# Patient Record
Sex: Female | Born: 1940
Health system: Southern US, Community
[De-identification: ages and names within clinical notes are randomized; demographics above are authoritative.]

## PROBLEM LIST (undated history)

## (undated) DIAGNOSIS — K219 Gastro-esophageal reflux disease without esophagitis: Secondary | ICD-10-CM

## (undated) DIAGNOSIS — IMO0001 Reserved for inherently not codable concepts without codable children: Secondary | ICD-10-CM

## (undated) DIAGNOSIS — I10 Essential (primary) hypertension: Secondary | ICD-10-CM

## (undated) DIAGNOSIS — K589 Irritable bowel syndrome without diarrhea: Secondary | ICD-10-CM

## (undated) DIAGNOSIS — B029 Zoster without complications: Secondary | ICD-10-CM

## (undated) DIAGNOSIS — E785 Hyperlipidemia, unspecified: Secondary | ICD-10-CM

## (undated) HISTORY — DX: Essential (primary) hypertension: I10

## (undated) HISTORY — DX: Gastro-esophageal reflux disease without esophagitis: K21.9

## (undated) HISTORY — DX: Reserved for inherently not codable concepts without codable children: IMO0001

## (undated) HISTORY — DX: Irritable bowel syndrome, unspecified: K58.9

## (undated) HISTORY — DX: Zoster without complications: B02.9

## (undated) HISTORY — DX: Hyperlipidemia, unspecified: E78.5

---

## 1975-05-28 HISTORY — PX: TOTAL ABDOMINAL HYSTERECTOMY W/ BILATERAL SALPINGOOPHORECTOMY: SHX83

## 1979-05-28 HISTORY — PX: PLACEMENT OF BREAST IMPLANTS: SHX6334

## 1999-07-06 ENCOUNTER — Other Ambulatory Visit: Admission: RE | Admit: 1999-07-06 | Discharge: 1999-07-06 | Payer: Self-pay | Admitting: Internal Medicine

## 2000-07-08 ENCOUNTER — Other Ambulatory Visit: Admission: RE | Admit: 2000-07-08 | Discharge: 2000-07-08 | Payer: Self-pay | Admitting: Internal Medicine

## 2000-09-15 ENCOUNTER — Encounter: Admission: RE | Admit: 2000-09-15 | Discharge: 2000-09-15 | Payer: Self-pay | Admitting: Internal Medicine

## 2000-09-15 ENCOUNTER — Encounter: Payer: Self-pay | Admitting: Internal Medicine

## 2001-07-17 ENCOUNTER — Other Ambulatory Visit: Admission: RE | Admit: 2001-07-17 | Discharge: 2001-07-17 | Payer: Self-pay | Admitting: Internal Medicine

## 2002-07-08 ENCOUNTER — Other Ambulatory Visit: Admission: RE | Admit: 2002-07-08 | Discharge: 2002-07-08 | Payer: Self-pay | Admitting: Internal Medicine

## 2002-10-19 ENCOUNTER — Ambulatory Visit (HOSPITAL_BASED_OUTPATIENT_CLINIC_OR_DEPARTMENT_OTHER): Admission: RE | Admit: 2002-10-19 | Discharge: 2002-10-19 | Payer: Self-pay | Admitting: Plastic Surgery

## 2002-10-19 ENCOUNTER — Encounter (INDEPENDENT_AMBULATORY_CARE_PROVIDER_SITE_OTHER): Payer: Self-pay | Admitting: Specialist

## 2005-07-16 ENCOUNTER — Other Ambulatory Visit: Admission: RE | Admit: 2005-07-16 | Discharge: 2005-07-16 | Payer: Self-pay | Admitting: Internal Medicine

## 2005-08-21 ENCOUNTER — Ambulatory Visit: Payer: Self-pay

## 2007-07-24 ENCOUNTER — Encounter: Admission: RE | Admit: 2007-07-24 | Discharge: 2007-07-24 | Payer: Self-pay | Admitting: Internal Medicine

## 2008-08-09 ENCOUNTER — Ambulatory Visit: Payer: Self-pay | Admitting: Internal Medicine

## 2008-08-09 ENCOUNTER — Other Ambulatory Visit: Admission: RE | Admit: 2008-08-09 | Discharge: 2008-08-09 | Payer: Self-pay | Admitting: Internal Medicine

## 2008-08-18 ENCOUNTER — Encounter: Admission: RE | Admit: 2008-08-18 | Discharge: 2008-08-18 | Payer: Self-pay | Admitting: Internal Medicine

## 2009-02-06 ENCOUNTER — Ambulatory Visit: Payer: Self-pay | Admitting: Internal Medicine

## 2009-08-07 ENCOUNTER — Ambulatory Visit: Payer: Self-pay | Admitting: Internal Medicine

## 2009-12-28 ENCOUNTER — Ambulatory Visit: Payer: Self-pay | Admitting: Internal Medicine

## 2010-03-13 ENCOUNTER — Ambulatory Visit: Payer: Self-pay | Admitting: Internal Medicine

## 2010-04-13 ENCOUNTER — Ambulatory Visit: Payer: Self-pay | Admitting: Internal Medicine

## 2010-04-19 IMAGING — CT CT HEAD W/O CM
2 series · 16 of 30 positions shown, 20 images · non-contrast
Comparison: None.

CLINICAL DATA: Visual disturbance.

CT HEAD WITHOUT CONTRAST
TECHNIQUE: Contiguous axial images were obtained from the base of
the skull through the vertex without contrast.

[Series 3: bone windows · axial · 0.43mm/px · z∈[+19,+60]mm · 3 of 28 slices shown]
[im 2/28  bone]
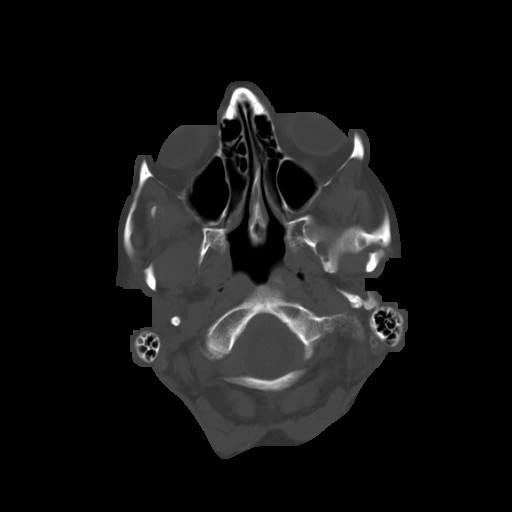
[im 6/28  bone]
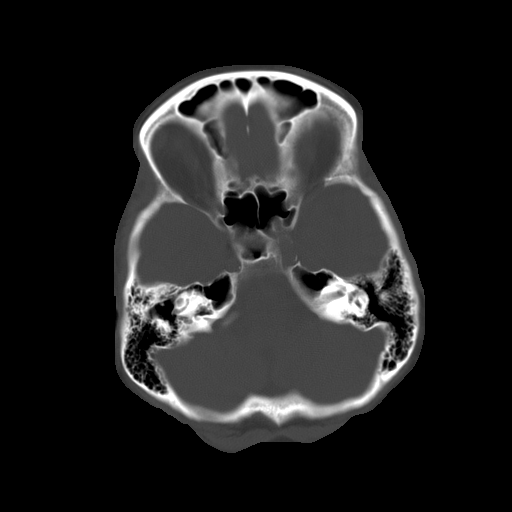
[im 10/28  bone]
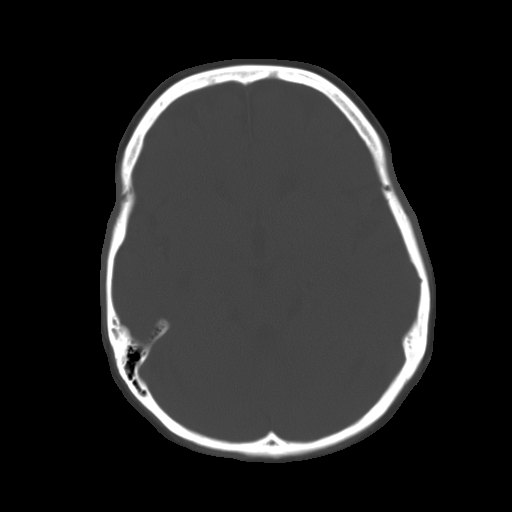

[Series 32: 3d filtered head w/o · axial · non-contrast · 0.43mm/px · z∈[+19,+140]mm · 13 of 28 slices shown, 17 images]
[im 2/28  brain]
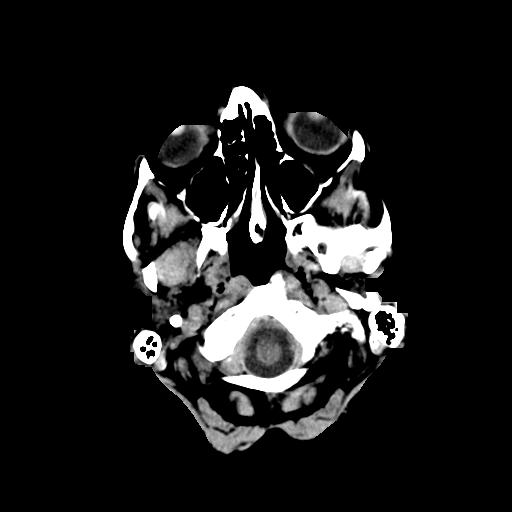
[im 2/28  bone]
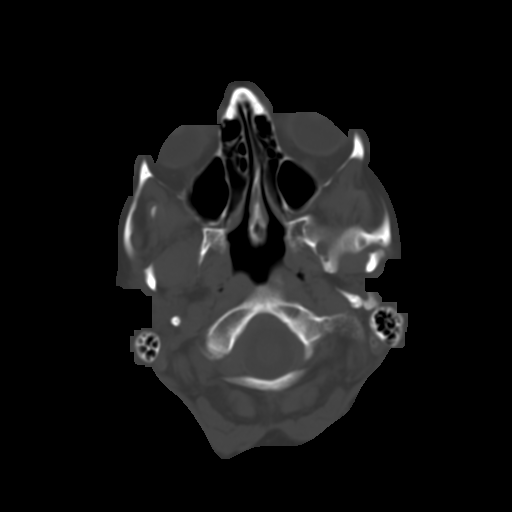
[im 4/28  brain]
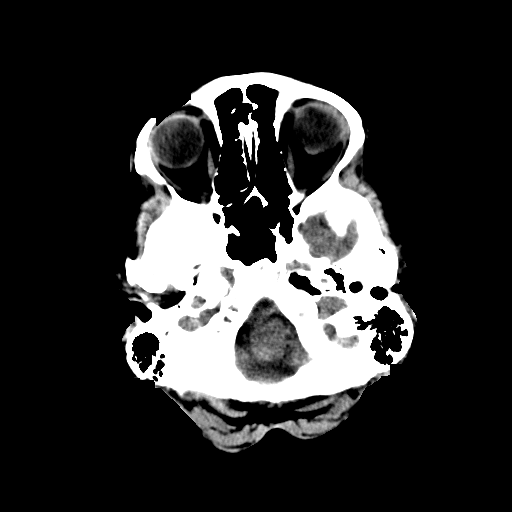
[im 6/28  brain]
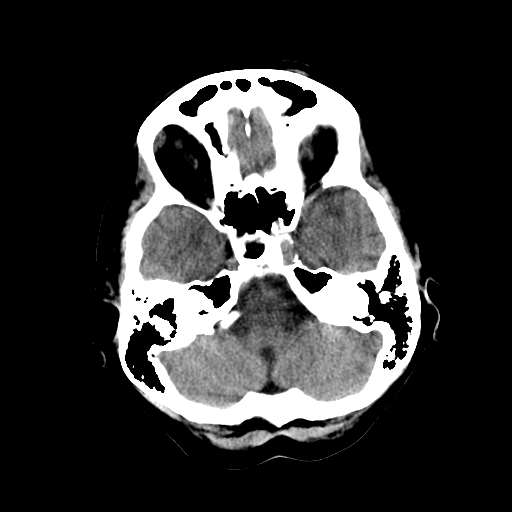
[im 8/28  brain]
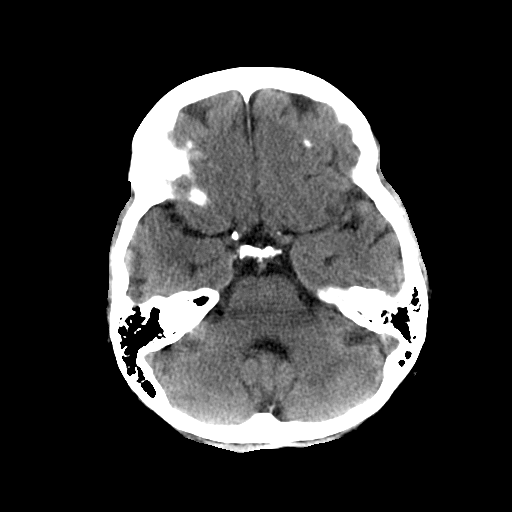
[im 10/28  brain]
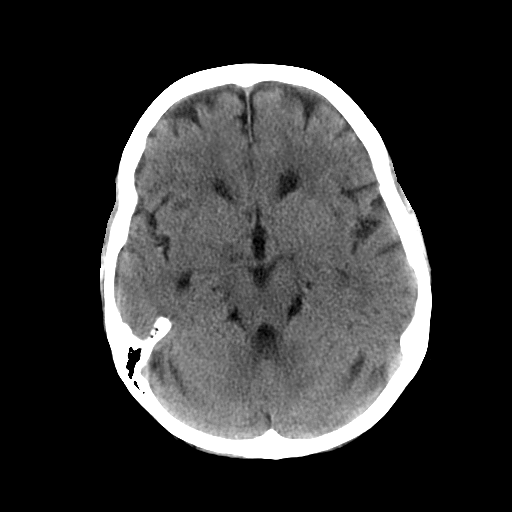
[im 10/28  bone]
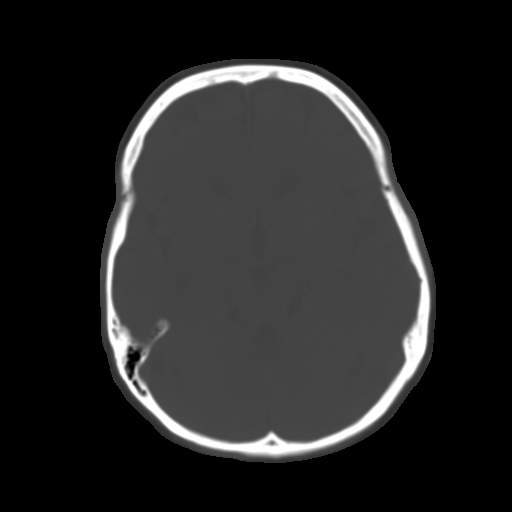
[im 12/28  brain]
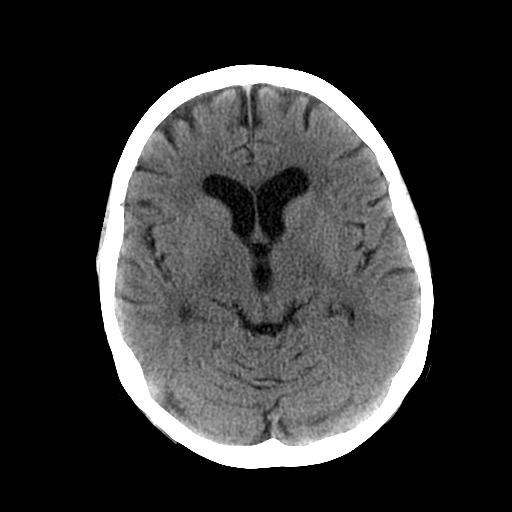
[im 14/28  brain]
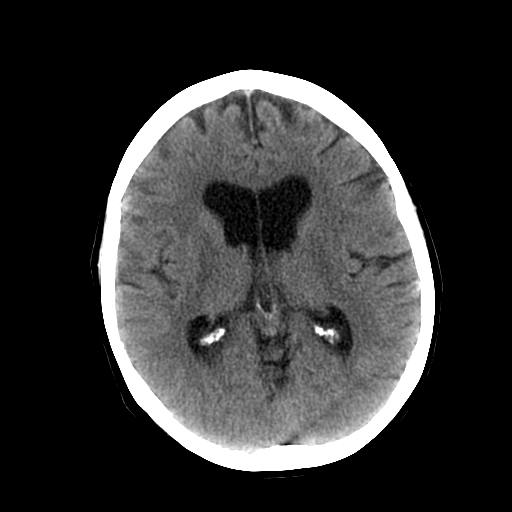
[im 16/28  brain]
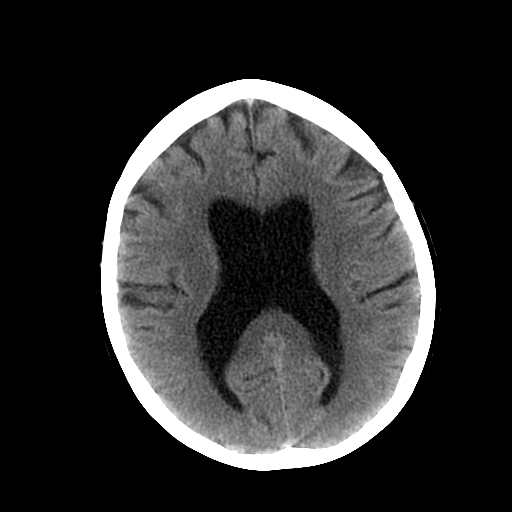
[im 18/28  brain]
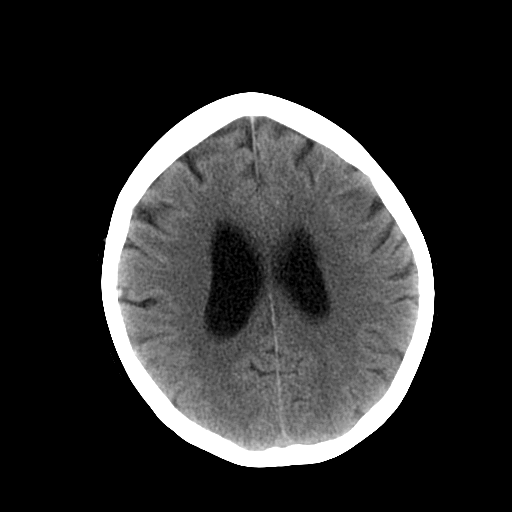
[im 18/28  bone]
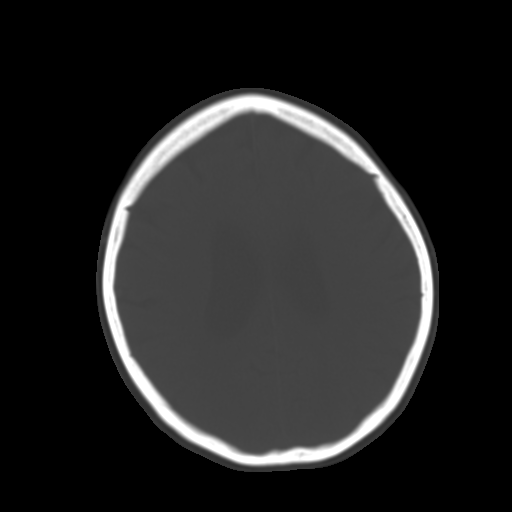
[im 20/28  brain]
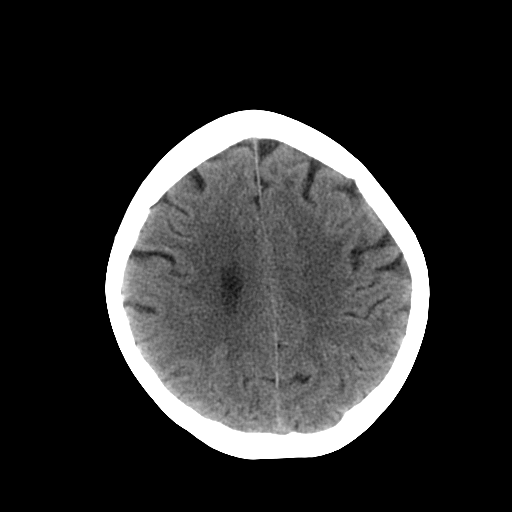
[im 22/28  brain]
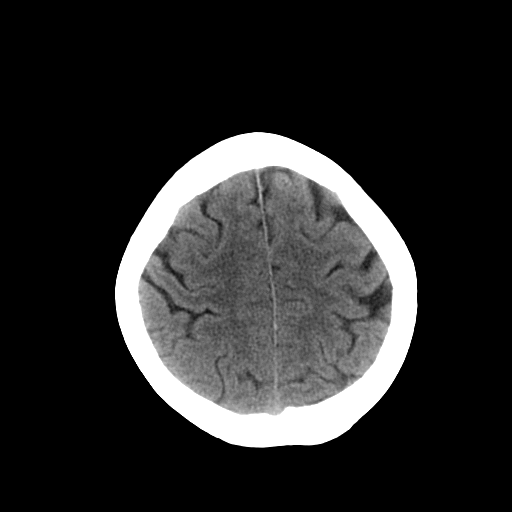
[im 24/28  brain]
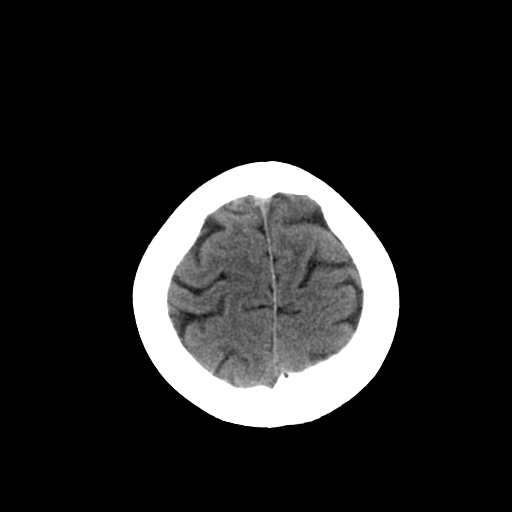
[im 26/28  brain]
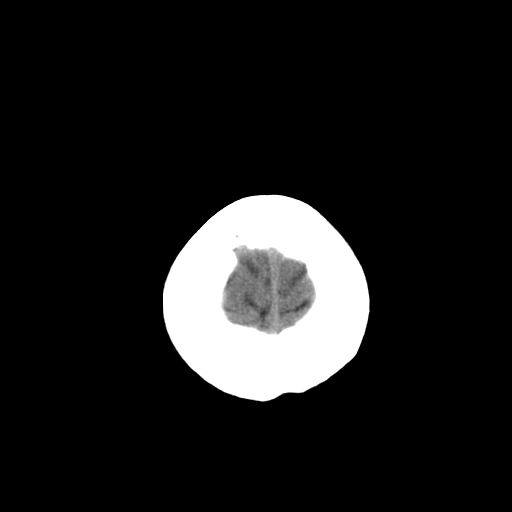
[im 26/28  bone]
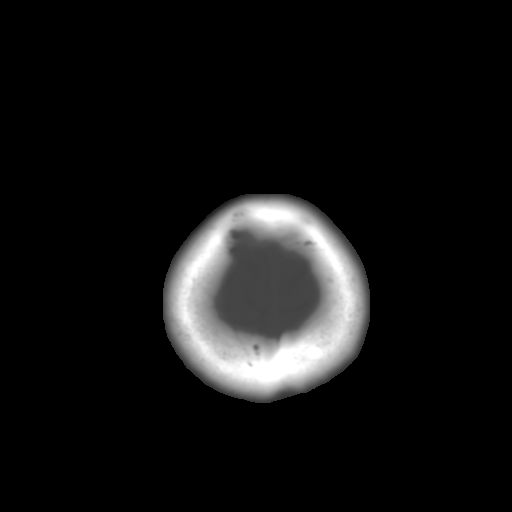

[16 of 30 positions shown; findings below may reference images not displayed]

FINDINGS: There is no evidence for acute hemorrhage, hydrocephalus,
mass lesion, or abnormal extra-axial fluid collection.  No definite
CT evidence for acute infarction.

The visualized paranasal sinuses and mastoid air cells are clear.
IMPRESSION: No acute intracranial abnormality.

Areas of acute ischemia may be inapparent on CT examination of the
brain for up to 24-48 hours.

## 2010-05-31 ENCOUNTER — Ambulatory Visit
Admission: RE | Admit: 2010-05-31 | Discharge: 2010-05-31 | Payer: Self-pay | Source: Home / Self Care | Attending: Internal Medicine | Admitting: Internal Medicine

## 2010-10-08 ENCOUNTER — Other Ambulatory Visit: Payer: Self-pay | Admitting: Internal Medicine

## 2010-10-12 NOTE — Op Note (Signed)
NAME:  Kristen Hopkins, Kristen Hopkins                          ACCOUNT NO.:  [id number]   MEDICAL RECORD NO.:  [id number]                   PATIENT TYPE:  AMB   LOCATION:  [facility]                                  FACILITY:  [facility]   PHYSICIAN:  Renee Pain, M.D.               DATE OF BIRTH:  12-16-1940   DATE OF PROCEDURE:  10/19/2002  DATE OF DISCHARGE:                                 OPERATIVE REPORT   PREOPERATIVE DIAGNOSIS:  A 1.5 cm lipoma, left forehead, just above the  medial eyebrow.   POSTOPERATIVE DIAGNOSIS:  A 1.5 cm lipoma, left forehead, just above the  medial eyebrow.   OPERATION:  Excision of submuscular (frontalis), lipoma of left forehead.   SURGEON:  Renee Pain, M.D.   ANESTHESIA:  MAC, supplemented with 1% Xylocaine and 1:100,000 epinephrine.   INDICATIONS FOR PROCEDURE:  This is a 70 year old woman with a steadily  enlarging left forehead lump mass.  She wishes to have this removed.  Clinically it appears to be a lipoma.  In light of the fact that it has been  growing and becoming more noticeable, she wishes to have it removed.  She  understands that she will be trading what she has for a permanent and  potentially unsightly scar.  In addition, because of its location near the  supraorbital vessels and nerves, she may end up with permanent numbness of  the forehead or scalp.  In spite of that potential risk, she wishes to  proceed with the surgery.   DESCRIPTION OF PROCEDURE:  After adequate intravenous analgesia had been  given, local anesthesia was then infiltrated just over the lipoma.  After  waiting for approximately seven minutes, the forehead was prepped with  Betadine and draped with sterile drapes.  An approximately 2.0 cm incision  was made directly over the lipoma and deepened until reaching the  subcutaneous tissue in the frontalis muscle.  The frontalis muscle was  opened and the lipoma was visualized.  There were small branches of the  supraorbital  nerve which were growing over the lipoma.  These were retracted  out of the way.  The lipoma was dissected off of the periosteum of the  frontal bone.  The lipoma was removed in its entirety.  It was passed off  for pathology.  Hemostasis was maintained throughout using electrocautery.  The wound was irrigated with saline irrigation.  The wound was closed by  approximating the subcutaneous tissue and dermis with interrupted #5-0  Vicryl suture.  Steri-Strips were used to unite the skin edges.  The patient tolerated the procedure well, with minimal blood loss.  She was  transported to the recovery room in satisfactory condition.  Renee Pain, M.D.    WBB/MEDQ  D:  10/19/2002  T:  10/19/2002  Job:  [id number]

## 2010-10-14 ENCOUNTER — Other Ambulatory Visit: Payer: Self-pay | Admitting: Internal Medicine

## 2011-01-17 ENCOUNTER — Other Ambulatory Visit: Payer: Self-pay | Admitting: Internal Medicine

## 2011-01-18 ENCOUNTER — Other Ambulatory Visit: Payer: Self-pay

## 2011-01-18 MED ORDER — INDAPAMIDE 1.25 MG PO TABS: 1.2500 mg | ORAL_TABLET | Freq: Every day | ORAL | Status: DC

## 2011-01-18 MED ORDER — ATORVASTATIN CALCIUM 10 MG PO TABS: 10.0000 mg | ORAL_TABLET | Freq: Every day | ORAL | Status: DC

## 2011-04-14 ENCOUNTER — Other Ambulatory Visit: Payer: Self-pay | Admitting: Internal Medicine

## 2011-04-16 ENCOUNTER — Other Ambulatory Visit: Payer: Self-pay

## 2011-06-07 ENCOUNTER — Other Ambulatory Visit: Payer: BC Managed Care – PPO | Admitting: Internal Medicine

## 2011-06-07 DIAGNOSIS — Z79899 Other long term (current) drug therapy: Secondary | ICD-10-CM

## 2011-06-07 DIAGNOSIS — E785 Hyperlipidemia, unspecified: Secondary | ICD-10-CM

## 2011-06-07 DIAGNOSIS — I10 Essential (primary) hypertension: Secondary | ICD-10-CM

## 2011-06-07 LAB — CBC WITH DIFFERENTIAL/PLATELET
Basophils Absolute: 0.1 10*3/uL (ref 0.0–0.1)
Eosinophils Relative: 2 % (ref 0–5)
HCT: 38.2 % (ref 36.0–46.0)
Lymphocytes Relative: 33 % (ref 12–46)
Lymphs Abs: 3.5 10*3/uL (ref 0.7–4.0)
MCV: 97.9 fL (ref 78.0–100.0)
Monocytes Absolute: 0.6 10*3/uL (ref 0.1–1.0)
Monocytes Relative: 6 % (ref 3–12)
RDW: 13.5 % (ref 11.5–15.5)
WBC: 10.6 10*3/uL — ABNORMAL HIGH (ref 4.0–10.5)

## 2011-06-07 LAB — LIPID PANEL
Cholesterol: 147 mg/dL (ref 0–200)
HDL: 72 mg/dL (ref >39)
Triglycerides: 99 mg/dL (ref <150)

## 2011-06-07 LAB — COMPREHENSIVE METABOLIC PANEL
BUN: 15 mg/dL (ref 6–23)
CO2: 26 mEq/L (ref 19–32)
Calcium: 9.8 mg/dL (ref 8.4–10.5)
Chloride: 98 mEq/L (ref 96–112)
Creat: 0.61 mg/dL (ref 0.50–1.10)
Glucose, Bld: 96 mg/dL (ref 70–99)

## 2011-06-08 LAB — VITAMIN D 25 HYDROXY (VIT D DEFICIENCY, FRACTURES): Vit D, 25-Hydroxy: 56 ng/mL (ref 30–89)

## 2011-06-10 ENCOUNTER — Encounter: Payer: Self-pay | Admitting: Internal Medicine

## 2011-06-10 ENCOUNTER — Ambulatory Visit (INDEPENDENT_AMBULATORY_CARE_PROVIDER_SITE_OTHER): Payer: BC Managed Care – PPO | Admitting: Internal Medicine

## 2011-06-10 DIAGNOSIS — Z23 Encounter for immunization: Secondary | ICD-10-CM

## 2011-06-10 DIAGNOSIS — E785 Hyperlipidemia, unspecified: Secondary | ICD-10-CM

## 2011-06-10 DIAGNOSIS — I10 Essential (primary) hypertension: Secondary | ICD-10-CM

## 2011-06-10 DIAGNOSIS — Z124 Encounter for screening for malignant neoplasm of cervix: Secondary | ICD-10-CM

## 2011-06-10 DIAGNOSIS — Z0000 Encounter for general adult medical examination without abnormal findings: Secondary | ICD-10-CM

## 2011-06-10 LAB — POCT URINALYSIS DIPSTICK
Bilirubin, UA: NEGATIVE
Blood, UA: NEGATIVE
Glucose, UA: NEGATIVE
Nitrite, UA: NEGATIVE
Spec Grav, UA: 1.01

## 2011-06-10 MED ORDER — PNEUMOCOCCAL VAC POLYVALENT 25 MCG/0.5ML IJ INJ: 0.5000 mL | INJECTION | INTRAMUSCULAR | Status: AC

## 2011-06-10 MED ORDER — TETANUS-DIPHTH-ACELL PERTUSSIS 5-2.5-18.5 LF-MCG/0.5 IM SUSP: 0.5000 mL | Freq: Once | INTRAMUSCULAR | Status: DC

## 2011-06-10 NOTE — Patient Instructions (Signed)
Continue same medications. You have been given pneumococcal vaccine and tetanus immunization today. You had already received Zostavax vaccine in November 2011. He had been given a requisition to obtain a mammogram. He had been given requisition for bone density study. Return in 6 months.

## 2011-06-10 NOTE — Progress Notes (Signed)
Subjective:    [name], female    DOB: [birth year], 71 y.o.   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 History of hypertension and hyperlipidemia. Had ophthalmic migraine 2010. Had herpes zoster October 2001. Received Zostavax vaccine 04/13/2010. Has declined colonoscopy. Did agree to do 3 Hemoccult cards which were distributed today. A Cardiolite study 2007. Last mammogram was June 2011. Was given Pneumovax and tetanus immunization updates today. Was on estrogen replacement for many years status post hysterectomy with any oophorectomy 1977. History of bilateral breast implants 1981. Surgery for herniated lumbar disc L5-S1 1998.  Patient cannot tolerate Maxzide-she had photosensitivity reaction to this. Hycodan doesn't agree with her. She can take Tussionex. Amlodipine has caused leg swelling.  Patient has been followed here since 1994 when she moved from [facility]. At that time already had hypertension and hyperlipidemia.  Quit smoking in 1998. Drinks wine socially. Had flexible sigmoidoscopy in 1999.  Cardiolite study in 2007 was prompted by a brother who died at age 2 of a massive MI.  Father with history of coronary artery disease and hypertension. One brother with history of Crohn's disease.  Patient is married. Has 3 adult children. Husband has worked in Personal assistant and also Leisure centre manager.    Review of Systems  Constitutional: Negative.   HENT: Negative.   Eyes: Negative.   Respiratory: Negative.   Cardiovascular: Negative.   Gastrointestinal: Negative.   Genitourinary: Negative.   Musculoskeletal: Negative.   Neurological: Negative.   Hematological: Negative.   Psychiatric/Behavioral: Negative.        Objective:   Physical Exam  Nursing note and vitals reviewed. Constitutional: She is oriented to person, place, and time. She appears well-developed and well-nourished. No  distress.  HENT:  Head: Normocephalic and atraumatic.  Right Ear: External ear normal.  Left Ear: External ear normal.  Nose: Nose normal.  Mouth/Throat: Oropharynx is clear and moist.  Eyes: Conjunctivae and EOM are normal. Pupils are equal, round, and reactive to light. Right eye exhibits no discharge. Left eye exhibits no discharge. No scleral icterus.  Neck: Neck supple. No JVD present. No thyromegaly present.  Cardiovascular: Normal rate, regular rhythm and normal heart sounds.   No murmur heard. Pulmonary/Chest: Breath sounds normal. She has no wheezes. She has no rales.       Bilateral breast implants.  Abdominal: Soft. Bowel sounds are normal. She exhibits no distension and no mass. There is no tenderness. There is no rebound and no guarding.  Genitourinary:       Pap taken of vaginal cuff. No masses bimanual exam  Musculoskeletal: Normal range of motion. She exhibits no edema.  Lymphadenopathy:    She has no cervical adenopathy.  Neurological: She is alert and oriented to person, place, and time. She has normal reflexes. No cranial nerve deficit. Coordination normal.  Skin: Skin is warm and dry. No rash noted. She is not diaphoretic.  Psychiatric: She has a normal mood and affect. Her behavior is normal. Judgment and thought content normal.          Assessment & Plan:  Hypertension  Hyperlipidemia  Recent lab work reviewed with her. Lipid panel within normal limits.  Plan: Patient declines colonoscopy. Agrees to do 3 Hemoccult cards which were distributed today. GivenTdap and pneumococcal immunization. Is a really had Zostavax November 2011. Needs to have mammogram 2013. Requisition given along with requisition for bone density study. Return  in 6 months for office visit, lipid panel, liver functions, blood pressure check

## 2011-06-11 ENCOUNTER — Other Ambulatory Visit (HOSPITAL_COMMUNITY)
Admission: RE | Admit: 2011-06-11 | Discharge: 2011-06-11 | Disposition: A | Discharge status: Home / Self Care | Payer: BC Managed Care – PPO | Source: Ambulatory Visit | Attending: Internal Medicine | Admitting: Internal Medicine

## 2011-06-11 DIAGNOSIS — Z01419 Encounter for gynecological examination (general) (routine) without abnormal findings: Secondary | ICD-10-CM | POA: Insufficient documentation

## 2011-07-09 ENCOUNTER — Other Ambulatory Visit: Payer: Self-pay | Admitting: Internal Medicine

## 2011-07-10 ENCOUNTER — Other Ambulatory Visit: Payer: Self-pay

## 2011-07-10 MED ORDER — METOPROLOL SUCCINATE ER 25 MG PO TB24: 50.0000 mg | ORAL_TABLET | Freq: Every day | ORAL | Status: DC

## 2011-12-09 ENCOUNTER — Ambulatory Visit: Payer: BC Managed Care – PPO | Admitting: Internal Medicine

## 2011-12-19 ENCOUNTER — Other Ambulatory Visit: Payer: Self-pay | Admitting: Internal Medicine

## 2011-12-27 ENCOUNTER — Encounter: Payer: Self-pay | Admitting: Internal Medicine

## 2011-12-27 ENCOUNTER — Ambulatory Visit (INDEPENDENT_AMBULATORY_CARE_PROVIDER_SITE_OTHER): Payer: BC Managed Care – PPO | Admitting: Internal Medicine

## 2011-12-27 VITALS — BP 136/80 | HR 72 | Resp 12 | Ht 65.0 in | Wt 149.5 lb

## 2011-12-27 DIAGNOSIS — I10 Essential (primary) hypertension: Secondary | ICD-10-CM

## 2011-12-27 DIAGNOSIS — E785 Hyperlipidemia, unspecified: Secondary | ICD-10-CM

## 2011-12-27 DIAGNOSIS — Z79899 Other long term (current) drug therapy: Secondary | ICD-10-CM

## 2011-12-27 LAB — HEPATIC FUNCTION PANEL
ALT: 18 U/L (ref 0–35)
AST: 24 U/L (ref 0–37)
Albumin: 4.4 g/dL (ref 3.5–5.2)
Alkaline Phosphatase: 105 U/L (ref 39–117)
Total Protein: 7 g/dL (ref 6.0–8.3)

## 2011-12-27 LAB — LIPID PANEL
Cholesterol: 131 mg/dL (ref 0–200)
HDL: 59 mg/dL (ref >39)
LDL Cholesterol: 49 mg/dL (ref 0–99)
Triglycerides: 114 mg/dL (ref <150)

## 2011-12-27 NOTE — Patient Instructions (Addendum)
Continue same medications and return in 6 months for physical exam. If blood pressure remains higher than 139, please contact the office

## 2011-12-27 NOTE — Progress Notes (Signed)
  Subjective:    [name], female    DOB: 09-02-1940, 71 y.o.   MRN: [id number]  HPI In today for six-month recheck on hypertension. Has been on Lozol for many years. Did not tolerate Maxzide 25 because of photosensitivity with HCTZ. Says blood pressure has been running in the 130s at home when she checks it. Recently got back from vacation in [place] visiting her daughter who lives near [place].  Says it was a great vacation. Continues to work at the [employer]. History of hyperlipidemia as well as hypertension. Fasting lipid panel liver functions drawn today.         Review of Systems     Objective:   Physical Exam neck is supple without thyromegaly or carotid bruits; chest clear to auscultation; cardiac exam regular rate and rhythm normal S1 and S2; extremities without edema; skin is warm and dry; she is alert and oriented.         Assessment & Plan:  Hypertension  Hyperlipidemia  Plan: I checked blood pressure in both arms and got 130/80 in the left arm and 136/80 in the right arm  Continue to monitor blood pressure at home and let me know if blood pressure is running higher than 543 systolically

## 2012-01-08 ENCOUNTER — Other Ambulatory Visit: Payer: Self-pay

## 2012-01-08 MED ORDER — METOPROLOL SUCCINATE ER 25 MG PO TB24: 25.0000 mg | ORAL_TABLET | Freq: Every day | ORAL | Status: DC

## 2012-04-09 ENCOUNTER — Other Ambulatory Visit: Payer: Self-pay | Admitting: Internal Medicine

## 2012-06-17 ENCOUNTER — Other Ambulatory Visit: Payer: Self-pay

## 2012-06-17 MED ORDER — ATORVASTATIN CALCIUM 10 MG PO TABS: 10.0000 mg | ORAL_TABLET | Freq: Every day | ORAL | Status: DC

## 2012-06-24 ENCOUNTER — Other Ambulatory Visit: Payer: Self-pay

## 2012-06-24 MED ORDER — METOPROLOL SUCCINATE ER 25 MG PO TB24: 25.0000 mg | ORAL_TABLET | Freq: Every day | ORAL | Status: DC

## 2012-07-02 ENCOUNTER — Other Ambulatory Visit: Payer: BC Managed Care – PPO | Admitting: Internal Medicine

## 2012-07-02 DIAGNOSIS — Z0000 Encounter for general adult medical examination without abnormal findings: Secondary | ICD-10-CM

## 2012-07-02 LAB — COMPREHENSIVE METABOLIC PANEL
Albumin: 4.1 g/dL (ref 3.5–5.2)
BUN: 16 mg/dL (ref 6–23)
CO2: 30 mEq/L (ref 19–32)
Calcium: 9.8 mg/dL (ref 8.4–10.5)
Chloride: 97 mEq/L (ref 96–112)
Glucose, Bld: 96 mg/dL (ref 70–99)
Potassium: 4 mEq/L (ref 3.5–5.3)
Sodium: 140 mEq/L (ref 135–145)
Total Protein: 6.8 g/dL (ref 6.0–8.3)

## 2012-07-02 LAB — LIPID PANEL
Cholesterol: 142 mg/dL (ref 0–200)
HDL: 66 mg/dL (ref >39)
Triglycerides: 130 mg/dL (ref <150)

## 2012-07-02 LAB — CBC WITH DIFFERENTIAL/PLATELET
Basophils Relative: 0 % (ref 0–1)
HCT: 34.8 % — ABNORMAL LOW (ref 36.0–46.0)
Hemoglobin: 11.4 g/dL — ABNORMAL LOW (ref 12.0–15.0)
Lymphs Abs: 3.4 10*3/uL (ref 0.7–4.0)
MCHC: 32.8 g/dL (ref 30.0–36.0)
Monocytes Absolute: 0.7 10*3/uL (ref 0.1–1.0)
Monocytes Relative: 7 % (ref 3–12)
Neutro Abs: 6.3 10*3/uL (ref 1.7–7.7)
RBC: 3.75 MIL/uL — ABNORMAL LOW (ref 3.87–5.11)

## 2012-07-03 ENCOUNTER — Encounter: Payer: Self-pay | Admitting: Internal Medicine

## 2012-07-03 ENCOUNTER — Ambulatory Visit (INDEPENDENT_AMBULATORY_CARE_PROVIDER_SITE_OTHER): Payer: BC Managed Care – PPO | Admitting: Internal Medicine

## 2012-07-03 VITALS — BP 142/84 | HR 80 | Temp 97.7°F | Ht 64.85 in | Wt 149.0 lb

## 2012-07-03 DIAGNOSIS — K219 Gastro-esophageal reflux disease without esophagitis: Secondary | ICD-10-CM | POA: Insufficient documentation

## 2012-07-03 DIAGNOSIS — E785 Hyperlipidemia, unspecified: Secondary | ICD-10-CM

## 2012-07-03 DIAGNOSIS — Z0000 Encounter for general adult medical examination without abnormal findings: Secondary | ICD-10-CM

## 2012-07-03 DIAGNOSIS — I10 Essential (primary) hypertension: Secondary | ICD-10-CM

## 2012-07-03 LAB — POCT URINALYSIS DIPSTICK
Glucose, UA: NEGATIVE
Ketones, UA: NEGATIVE
Leukocytes, UA: NEGATIVE
Protein, UA: NEGATIVE
Spec Grav, UA: 1.02

## 2012-07-03 MED ORDER — METOPROLOL SUCCINATE ER 25 MG PO TB24: 50.0000 mg | ORAL_TABLET | Freq: Every day | ORAL | Status: DC

## 2012-07-03 MED ORDER — METOPROLOL SUCCINATE ER 25 MG PO TB24: 25.0000 mg | ORAL_TABLET | Freq: Every day | ORAL | Status: DC

## 2012-07-03 MED ORDER — ATORVASTATIN CALCIUM 10 MG PO TABS: 10.0000 mg | ORAL_TABLET | Freq: Every day | ORAL | Status: DC

## 2012-07-03 NOTE — Patient Instructions (Addendum)
Call with blood pressure readings in 2 weeks  And  take Prilosec daily. Return in 6 months.

## 2012-07-03 NOTE — Progress Notes (Signed)
Subjective:    [name], female    DOB: 11/28/1940, 72 y.o.   MRN: [id number]  HPI 72 year old white female with history of hypertension and hyperlipidemia in today for health maintenance and evaluation of medical problems. Pt also has GE reflux with frank water brash on occasion. She has been taking TUMS intermittently for relief. Blood pressure not as well-controlled as I would like to see. She says has been running about the same at home.   She continues to work full-time and is on [employer]. Had ophthalmic migraine in 2010. Had Herpes zoster October 2001. Receive Zostavax vaccine November 2011. Has declined colonoscopy. Did agree to do 3 Hemoccult cards which were given to her today. A Cardiolite study in 2007 that was negative. Gave tetanus immunization and Pneumovax immunizations 06/23/2011. She was on estrogen or placement for many years status post hysterectomy with oophorectomy 1977. History of bilateral breast implants 1981. Surgery for herniated lumbar disc L5-S1 in 1998.  Patient cannot tolerate Maxzide she had a photosensitivity reaction to this. Hycodan doesn't agree with her. She can take Tussionex however. Amlodipine has caused leg swelling.  Quit smoking in 1998. Drinks wine socially. Had flexible sigmoidoscopy 1999.  Family history: Father with history of coronary artery disease and hypertension. One brother with history of Crohn's disease. A brother died at age 61 of a massive MI.  Social history: She is married has 3 adult children. Husband is worked in Tax adviser.  Concern today is for elevated blood pressure.     Review of Systems  Constitutional: Negative.   HENT: Negative.   Eyes: Negative.   Respiratory: Negative.   Cardiovascular: Negative.   Gastrointestinal: Negative.   Endocrine: Negative.   Genitourinary: Negative.   Allergic/Immunologic: Negative.   Neurological: Negative.   Hematological: Negative.    Psychiatric/Behavioral: Negative.        Objective:   Physical Exam  Vitals reviewed. Constitutional: She is oriented to person, place, and time. She appears well-developed and well-nourished. No distress.  HENT:  Head: Normocephalic and atraumatic.  Right Ear: External ear normal.  Left Ear: External ear normal.  Mouth/Throat: Oropharynx is clear and moist. No oropharyngeal exudate.  Eyes: Conjunctivae and EOM are normal. Pupils are equal, round, and reactive to light. Right eye exhibits no discharge. Left eye exhibits no discharge. No scleral icterus.  Neck: Neck supple. No JVD present. No thyromegaly present.  Cardiovascular: Normal rate, regular rhythm, normal heart sounds and intact distal pulses.   No murmur heard. Pulmonary/Chest: Effort normal and breath sounds normal. No respiratory distress. She has no wheezes. She has no rales.  Abdominal: Soft. Bowel sounds are normal. She exhibits no distension and no mass. There is no tenderness. There is no rebound and no guarding.  Musculoskeletal: Normal range of motion. She exhibits no edema.  Lymphadenopathy:    She has no cervical adenopathy.  Neurological: She is alert and oriented to person, place, and time. She has normal reflexes. No cranial nerve deficit. Coordination normal.  Skin: Skin is warm and dry. No rash noted. She is not diaphoretic.  Psychiatric: She has a normal mood and affect. Her behavior is normal. Judgment and thought content normal.          Assessment & Plan:  Hypertension  Hyperlipidemia-stable  GE reflux  Plan: Prilosec 20 mg daily. Increase Toprol to 50 mg daily. She is to keep blood pressure readings 34 times weekly and call me in 2 weeks with readings.  Continue Lozol. She has had an adverse reaction to HCTZ in the remote past consisting of photosensitivity reaction. That is why she is on indapamide (Lozol). Return in 6 months or as needed. Lab work reviewed with her today within normal limits

## 2012-07-22 ENCOUNTER — Other Ambulatory Visit: Payer: Self-pay | Admitting: Internal Medicine

## 2012-07-26 ENCOUNTER — Encounter: Payer: Self-pay | Admitting: Internal Medicine

## 2012-08-04 ENCOUNTER — Telehealth: Payer: Self-pay

## 2012-08-04 NOTE — Telephone Encounter (Signed)
Called patient this am. Per Dr. Renold Genta her Toprol was increased on 06/30/2012 and she was supposed to have called back with BP readings in 2 weeks. i left her a message on her cell phone to this effect.

## 2012-12-21 ENCOUNTER — Other Ambulatory Visit: Payer: Self-pay | Admitting: Internal Medicine

## 2012-12-31 ENCOUNTER — Other Ambulatory Visit: Payer: BC Managed Care – PPO | Admitting: Internal Medicine

## 2012-12-31 DIAGNOSIS — Z79899 Other long term (current) drug therapy: Secondary | ICD-10-CM

## 2012-12-31 DIAGNOSIS — E785 Hyperlipidemia, unspecified: Secondary | ICD-10-CM

## 2012-12-31 LAB — HEPATIC FUNCTION PANEL
ALT: 21 U/L (ref 0–35)
AST: 23 U/L (ref 0–37)
Albumin: 4.1 g/dL (ref 3.5–5.2)
Alkaline Phosphatase: 102 U/L (ref 39–117)
Total Bilirubin: 0.6 mg/dL (ref 0.3–1.2)

## 2012-12-31 LAB — LIPID PANEL
Cholesterol: 135 mg/dL (ref 0–200)
HDL: 61 mg/dL (ref >39)
Total CHOL/HDL Ratio: 2.2 Ratio
Triglycerides: 134 mg/dL (ref <150)

## 2013-01-01 ENCOUNTER — Ambulatory Visit: Payer: BC Managed Care – PPO | Admitting: Internal Medicine

## 2013-01-04 ENCOUNTER — Encounter: Payer: Self-pay | Admitting: Internal Medicine

## 2013-01-04 ENCOUNTER — Ambulatory Visit (INDEPENDENT_AMBULATORY_CARE_PROVIDER_SITE_OTHER): Payer: BC Managed Care – PPO | Admitting: Internal Medicine

## 2013-01-04 VITALS — BP 158/84 | HR 80 | Temp 98.0°F | Wt 148.0 lb

## 2013-01-04 DIAGNOSIS — A088 Other specified intestinal infections: Secondary | ICD-10-CM

## 2013-01-04 DIAGNOSIS — I10 Essential (primary) hypertension: Secondary | ICD-10-CM

## 2013-01-04 DIAGNOSIS — A084 Viral intestinal infection, unspecified: Secondary | ICD-10-CM

## 2013-01-04 DIAGNOSIS — E785 Hyperlipidemia, unspecified: Secondary | ICD-10-CM

## 2013-01-04 MED ORDER — AMLODIPINE BESYLATE 5 MG PO TABS: 5.0000 mg | ORAL_TABLET | Freq: Every day | ORAL | Status: DC

## 2013-01-24 ENCOUNTER — Other Ambulatory Visit: Payer: Self-pay | Admitting: Internal Medicine

## 2013-01-24 NOTE — Patient Instructions (Addendum)
Begin amlodipine 5 mg daily. Return in 3 weeks. Continue other antihypertensive medications

## 2013-01-24 NOTE — Progress Notes (Signed)
  Subjective:    [name], female    DOB: August 27, 1940, 72 y.o.   MRN: [id number]  HPI  72 year old white female with history of hypertension hyperlipidemia and GE reflux in today for six-month recheck. Blood pressure is markedly elevated today. She has not been feeling well having had a bout of diarrhea for several days with nausea. Sounds like she has had a prior of gastroenteritis but is getting better. She is on Cozaar 100 mg daily, indapamide, metoprolol, Lipitor. Has had photosensitivity reaction to HCTZ in the past when she [place] so that is why she has remained on indapamide.     Review of Systems     Objective:   Physical Exam Neck is supple without JVD, thyromegaly, or carotid bruits. Chest clear to auscultation. Cardiac exam: regular rate and rhythm normal S1 and S2. Extremities without edema. Abdomen no hepatosplenomegaly masses or significant tenderness.        Assessment & Plan:   Viral gastroenteritis-improving. Recommend clear liquids and advance diet slowly.  Hypertension-controll could be better  Hyperlipidemia-treated with Lipitor. Lipid panel liver functions are within normal limits.  Plan: Options include adding Lasix and discontinuing indapamide, ddding amlodipine and could consider stopping metoprolol. Patient needs to purchase home blood pressure monitoring keep an eye on her blood pressure. We will add amlodipine 5 mg daily and see her again in 3 weeks.  25 minutes spent with patient

## 2013-02-08 ENCOUNTER — Ambulatory Visit (INDEPENDENT_AMBULATORY_CARE_PROVIDER_SITE_OTHER): Payer: BC Managed Care – PPO | Admitting: Internal Medicine

## 2013-02-08 ENCOUNTER — Encounter: Payer: Self-pay | Admitting: Internal Medicine

## 2013-02-08 VITALS — BP 128/58 | HR 64 | Temp 98.1°F | Wt 148.0 lb

## 2013-02-08 DIAGNOSIS — K589 Irritable bowel syndrome without diarrhea: Secondary | ICD-10-CM

## 2013-02-08 DIAGNOSIS — I10 Essential (primary) hypertension: Secondary | ICD-10-CM

## 2013-02-08 MED ORDER — DICYCLOMINE HCL 20 MG PO TABS: 20.0000 mg | ORAL_TABLET | Freq: Four times a day (QID) | ORAL | Status: DC

## 2013-02-08 NOTE — Progress Notes (Signed)
  Subjective:    [name], female    DOB: 11/19/1940, 72 y.o.   MRN: [id number]  HPI  Since last visit, has persisted with frequent bowel movements 30 min to one hour after eating.  No abdominal pain. No cramping. No blood in bowel movement.   Urgency to defecate.  Husband having shoulder replacement this coming Thursday. She will need to be the primary caregiver. No antibiotics. No travel history. She is really here regarding blood pressure control. At last visit we started her on amlodipine. She was having issues with diarrhea before starting amlodipine.    Review of Systems     Objective:   Physical Exam chest clear to auscultation. Cardiac exam regular rate and rhythm. Abdomen is benign. Extremities without edema        Assessment & Plan:  Diarrhea-symptoms are consistent with irritable bowel syndrome  Anxiety-surrounding upcoming surgery husband is having on shoulder  Hypertension-stable on current regimen  Plan: Bentyl 20 mg one half hour before meals and bedtime. No change in antihypertensive medication schedule physical examination for February 2015. If symptoms persist with regard to diarrhea and do not respond to Bentyl and diet modification, recontact [place] for further advisement.

## 2013-02-08 NOTE — Patient Instructions (Addendum)
Continue same antihypertensive medication. Return in February 2015 for physical exam. Try been to help 20 mg one half hour before meals and at bedtime for diarrhea

## 2013-02-21 ENCOUNTER — Other Ambulatory Visit: Payer: Self-pay | Admitting: Internal Medicine

## 2013-03-06 ENCOUNTER — Other Ambulatory Visit: Payer: Self-pay | Admitting: Internal Medicine

## 2013-07-08 ENCOUNTER — Telehealth: Payer: Self-pay

## 2013-07-08 ENCOUNTER — Telehealth: Payer: Self-pay | Admitting: Internal Medicine

## 2013-07-08 DIAGNOSIS — R197 Diarrhea, unspecified: Secondary | ICD-10-CM

## 2013-07-08 NOTE — Telephone Encounter (Signed)
Referral to [facility] done.

## 2013-07-08 NOTE — Telephone Encounter (Signed)
Please make GI appt.

## 2013-07-13 ENCOUNTER — Other Ambulatory Visit: Payer: Self-pay | Admitting: Internal Medicine

## 2013-07-13 ENCOUNTER — Other Ambulatory Visit: Payer: BC Managed Care – PPO | Admitting: Internal Medicine

## 2013-07-14 ENCOUNTER — Encounter: Payer: Self-pay | Admitting: Gastroenterology

## 2013-07-14 ENCOUNTER — Other Ambulatory Visit (INDEPENDENT_AMBULATORY_CARE_PROVIDER_SITE_OTHER): Payer: BC Managed Care – PPO | Admitting: Internal Medicine

## 2013-07-14 DIAGNOSIS — Z79899 Other long term (current) drug therapy: Secondary | ICD-10-CM

## 2013-07-14 DIAGNOSIS — Z13228 Encounter for screening for other metabolic disorders: Secondary | ICD-10-CM

## 2013-07-14 DIAGNOSIS — E785 Hyperlipidemia, unspecified: Secondary | ICD-10-CM

## 2013-07-14 DIAGNOSIS — I10 Essential (primary) hypertension: Secondary | ICD-10-CM

## 2013-07-14 DIAGNOSIS — Z130 Encounter for screening for diseases of the blood and blood-forming organs and certain disorders involving the immune mechanism: Secondary | ICD-10-CM

## 2013-07-14 DIAGNOSIS — Z1329 Encounter for screening for other suspected endocrine disorder: Secondary | ICD-10-CM

## 2013-07-14 LAB — CBC WITH DIFFERENTIAL/PLATELET
Basophils Absolute: 0.1 10*3/uL (ref 0.0–0.1)
Basophils Relative: 1 % (ref 0–1)
EOS ABS: 0.1 10*3/uL (ref 0.0–0.7)
Eosinophils Relative: 1 % (ref 0–5)
HCT: 36.9 % (ref 36.0–46.0)
Hemoglobin: 12.1 g/dL (ref 12.0–15.0)
LYMPHS PCT: 31 % (ref 12–46)
Lymphs Abs: 3.4 10*3/uL (ref 0.7–4.0)
MCH: 31 pg (ref 26.0–34.0)
MCHC: 32.8 g/dL (ref 30.0–36.0)
MCV: 94.6 fL (ref 78.0–100.0)
Monocytes Absolute: 0.7 10*3/uL (ref 0.1–1.0)
Monocytes Relative: 6 % (ref 3–12)
NEUTROS ABS: 6.8 10*3/uL (ref 1.7–7.7)
Neutrophils Relative %: 61 % (ref 43–77)
PLATELETS: 394 10*3/uL (ref 150–400)
RBC: 3.9 MIL/uL (ref 3.87–5.11)
RDW: 12.8 % (ref 11.5–15.5)
WBC: 11.1 10*3/uL — AB (ref 4.0–10.5)

## 2013-07-14 LAB — COMPREHENSIVE METABOLIC PANEL
ALT: 23 U/L (ref 0–35)
AST: 26 U/L (ref 0–37)
Albumin: 4.7 g/dL (ref 3.5–5.2)
Alkaline Phosphatase: 106 U/L (ref 39–117)
BILIRUBIN TOTAL: 0.5 mg/dL (ref 0.2–1.2)
BUN: 13 mg/dL (ref 6–23)
CO2: 27 mEq/L (ref 19–32)
Calcium: 9.9 mg/dL (ref 8.4–10.5)
Chloride: 97 mEq/L (ref 96–112)
Creat: 0.62 mg/dL (ref 0.50–1.10)
Glucose, Bld: 89 mg/dL (ref 70–99)
Potassium: 3.7 mEq/L (ref 3.5–5.3)
Sodium: 136 mEq/L (ref 135–145)
TOTAL PROTEIN: 7.5 g/dL (ref 6.0–8.3)

## 2013-07-14 LAB — LIPID PANEL
CHOLESTEROL: 158 mg/dL (ref 0–200)
HDL: 71 mg/dL (ref >39)
LDL CALC: 68 mg/dL (ref 0–99)
Total CHOL/HDL Ratio: 2.2 Ratio
Triglycerides: 96 mg/dL (ref <150)
VLDL: 19 mg/dL (ref 0–40)

## 2013-07-14 LAB — TSH: TSH: 2.841 u[IU]/mL (ref 0.350–4.500)

## 2013-07-15 ENCOUNTER — Encounter: Payer: Self-pay | Admitting: Internal Medicine

## 2013-07-15 ENCOUNTER — Ambulatory Visit (INDEPENDENT_AMBULATORY_CARE_PROVIDER_SITE_OTHER): Payer: BC Managed Care – PPO | Admitting: Internal Medicine

## 2013-07-15 VITALS — BP 134/76 | HR 72 | Temp 97.0°F | Ht 64.85 in | Wt 142.5 lb

## 2013-07-15 DIAGNOSIS — I10 Essential (primary) hypertension: Secondary | ICD-10-CM

## 2013-07-15 DIAGNOSIS — Z0000 Encounter for general adult medical examination without abnormal findings: Secondary | ICD-10-CM

## 2013-07-15 DIAGNOSIS — R152 Fecal urgency: Secondary | ICD-10-CM

## 2013-07-15 DIAGNOSIS — R197 Diarrhea, unspecified: Secondary | ICD-10-CM

## 2013-07-15 DIAGNOSIS — E785 Hyperlipidemia, unspecified: Secondary | ICD-10-CM

## 2013-07-15 DIAGNOSIS — N390 Urinary tract infection, site not specified: Secondary | ICD-10-CM

## 2013-07-15 DIAGNOSIS — Z1211 Encounter for screening for malignant neoplasm of colon: Secondary | ICD-10-CM

## 2013-07-15 LAB — POCT URINALYSIS DIPSTICK
BILIRUBIN UA: NEGATIVE
Blood, UA: NEGATIVE
Glucose, UA: NEGATIVE
Ketones, UA: NEGATIVE
NITRITE UA: POSITIVE
PROTEIN UA: NEGATIVE
Spec Grav, UA: 1.02
Urobilinogen, UA: NEGATIVE
pH, UA: 5.5

## 2013-07-15 LAB — HEMOCCULT GUIAC POC 1CARD (OFFICE): Fecal Occult Blood, POC: NEGATIVE

## 2013-07-15 LAB — VITAMIN D 25 HYDROXY (VIT D DEFICIENCY, FRACTURES): VIT D 25 HYDROXY: 64 ng/mL (ref 30–89)

## 2013-07-15 NOTE — Progress Notes (Signed)
Subjective:    [name], female    DOB: 17-Dec-1940, 73 y.o.   MRN: [id number]  HPI 73 year old white female in today for health maintenance exam and evaluation of medical issues. Has begun to have some issues with urgency of defecation that may occur 2-3 times over several hours every one and a half to 2 weeks. Says she has family history of Crohn's disease and one brother who is deceased due to COPD issues and another brother who has ulcerative colitis. She is concern she may have colitis. Has noticed some blood on toilet tissue recently after some of these episodes and is concerned. No bloody diarrhea. No travel history.  She has a history of hypertension and hyperlipidemia.  She has an abnormal urinalysis today and is asymptomatic. Culture was taken.   She continues to work full-time. She is married and has 3 adult children. Husband has worked in Personal assistant in Le[employer] manager. Quit smoking in 1998. Drinks wine socially.  Had sigmoidoscopy 1999.  Patient had ophthalmic migraine in 2010. Had herpes zoster October 2001. Received Zostavax vaccine 2011. Has declined colonoscopy. Cardiolite study in 2007 was negative. Received tetanus and Pneumovax immunizations January 2013. Has been on estrogen replacement for many years status post hysterectomy with oophorectomy 1977. History of bilateral breast implants 1981. Surgery for herniated lumbar disc L5-S1 1998.  She cannot tolerate Maxzide as she had a photosensitivity reaction to this. Hycodan does not agree with her but she can't take Fosamax. Amlodipine as caused leg swelling.    Review of Systems  As above     Objective:   Physical Exam  Vitals reviewed. Constitutional: She appears well-developed and well-nourished. No distress.  HENT:  Head: Normocephalic and atraumatic.  Right Ear: External ear normal.  Left Ear: External ear normal.  Mouth/Throat: Oropharynx is clear and moist. No oropharyngeal exudate.  Eyes:  Conjunctivae and EOM are normal. Pupils are equal, round, and reactive to light. Right eye exhibits no discharge. Left eye exhibits no discharge. No scleral icterus.  Neck: Neck supple. No JVD present. No thyromegaly present.  Cardiovascular: Normal rate, regular rhythm, normal heart sounds and intact distal pulses.   No murmur heard. Pulmonary/Chest: Effort normal and breath sounds normal. No respiratory distress. She has no wheezes. She has no rales. She exhibits no tenderness.  Abdominal: Soft. Bowel sounds are normal. She exhibits no distension and no mass. There is no tenderness. There is no rebound and no guarding.  Genitourinary: Guaiac negative stool.  Deferred due to hysterectomy  Musculoskeletal: Normal range of motion. She exhibits no edema.  Lymphadenopathy:    She has no cervical adenopathy.  Neurological: She has normal reflexes. She displays normal reflexes. No cranial nerve deficit. Coordination normal.  Skin: Skin is warm and dry. No rash noted. She is not diaphoretic.  Psychiatric: She has a normal mood and affect. Her behavior is normal. Judgment and thought content normal.          Assessment & Plan:  Hypertension- stable on current treatment  Hyperlipidemia -lipid panel is normal  Urgency of defecation/diarrhea-GI appointment March 2015. Family history of colitis. Possible colitis.  Abnormal urinalysis-culture pending no treatment given today  Plan: Return in 6 months for office visit, lipid panel liver functions and blood pressure check. Continue same medications for these issues.  Addendum: Urine culture greater than 100,000 colonies per milliliter of Escherichia coli. Treated with Cipro.  Hemoccult cards x1 in the office negative for occult blood.

## 2013-07-15 NOTE — Patient Instructions (Signed)
Urine culture pending due to abnormal urinalysis. Continue same medications. Appointment made for gastroenterologist in March

## 2013-07-17 LAB — URINE CULTURE: Colony Count: >=100000

## 2013-07-19 ENCOUNTER — Other Ambulatory Visit: Payer: Self-pay

## 2013-07-19 MED ORDER — CIPROFLOXACIN HCL 500 MG PO TABS: 500.0000 mg | ORAL_TABLET | Freq: Two times a day (BID) | ORAL | Status: DC

## 2013-08-11 ENCOUNTER — Encounter: Payer: Self-pay | Admitting: Gastroenterology

## 2013-08-11 ENCOUNTER — Ambulatory Visit (INDEPENDENT_AMBULATORY_CARE_PROVIDER_SITE_OTHER): Payer: BC Managed Care – PPO | Admitting: Gastroenterology

## 2013-08-11 DIAGNOSIS — R198 Other specified symptoms and signs involving the digestive system and abdomen: Secondary | ICD-10-CM

## 2013-08-11 DIAGNOSIS — R152 Fecal urgency: Secondary | ICD-10-CM

## 2013-08-11 MED ORDER — SOD PICOSULFATE-MAG OX-CIT ACD 10-3.5-12 MG-GM-GM PO PACK: 1.0000 | PACK | ORAL | Status: DC

## 2013-08-11 NOTE — Progress Notes (Signed)
    History of Present Illness: This is a  73 year old female who relates a history of worsening fecal urgency associated with softer and looser stools. Symptoms have been present for about a year but worsened over the past few months. She cannot clearly associate any particular food or activity with her urgency. Her symptoms occur about twice per week. She has had a few episodes of incontinence when unable to get to a bathroom quickly enough. She feels that gravy and cheese exacerbate her symptoms so she discontinued them. She takes a modest amount of caffeine and rarely takes milk products. Recent stool Hemoccult was negative. She states she had a flexible sigmoidoscopy performed many years ago, and no prior colonoscopy.  Denies weight loss, abdominal pain, constipation, change in stool caliber, melena, hematochezia, nausea, vomiting, dysphagia, reflux symptoms, chest pain.  Review of Systems: Pertinent positive and negative review of systems were noted in the above HPI section. All other review of systems were otherwise negative.  Current Medications, Allergies, Past Medical History, Past Surgical History, Family History and Social History were reviewed in [employer] record.  Physical Exam: General: Well developed , well nourished, no acute distress Head: Normocephalic and atraumatic Eyes:  sclerae anicteric, EOMI Ears: Normal auditory acuity Mouth: No deformity or lesions Neck: Supple, no masses or thyromegaly Lungs: Clear throughout to auscultation Heart: Regular rate and rhythm; no murmurs, rubs or bruits Abdomen: Soft, non tender and non distended. No masses, hepatosplenomegaly or hernias noted. Normal Bowel sounds Rectal: deferred to colonoscopy, recent exam by Dr. Renold Genta unremarkable with heme neg stool Musculoskeletal: Symmetrical with no gross deformities  Skin: No lesions on visible extremities Pulses:  Normal pulses noted Extremities: No clubbing, cyanosis,  edema or deformities noted Neurological: Alert oriented x 4, grossly nonfocal Cervical Nodes:  No significant cervical adenopathy Inguinal Nodes: No significant inguinal adenopathy Psychological:  Alert and cooperative. Normal mood and affect  Assessment and Recommendations:  1. Fecal urgency and change in bowel habits. Rule out colorectal neoplasms, colitis and other disorders. 10 day trial of lactose free diet. Imodium twice a day when necessary. The risks, benefits, and alternatives to colonoscopy with possible biopsy and possible polypectomy were discussed with the patient and they consent to proceed.

## 2013-08-11 NOTE — Patient Instructions (Signed)
You have been scheduled for a colonoscopy with propofol. Please follow written instructions given to you at your visit today.  Please pick up your prep kit at the pharmacy within the next 1-3 days. If you use inhalers (even only as needed), please bring them with you on the day of your procedure.  Stay on Lactose free diet x 10 days to see if this helps improve your symptoms.   Thank you for choosing me and [facility].  Malcolm T. Stark, Jr., MD., FACG   

## 2013-08-16 ENCOUNTER — Encounter: Payer: Self-pay | Admitting: Gastroenterology

## 2013-09-08 ENCOUNTER — Other Ambulatory Visit: Payer: Self-pay | Admitting: Internal Medicine

## 2013-10-01 ENCOUNTER — Encounter: Payer: Self-pay | Admitting: Gastroenterology

## 2013-10-01 ENCOUNTER — Ambulatory Visit (AMBULATORY_SURGERY_CENTER): Payer: BC Managed Care – PPO | Admitting: Gastroenterology

## 2013-10-01 VITALS — BP 127/72 | HR 71 | Temp 97.6°F | Resp 17 | Ht 64.5 in | Wt 142.0 lb

## 2013-10-01 DIAGNOSIS — R152 Fecal urgency: Secondary | ICD-10-CM

## 2013-10-01 DIAGNOSIS — R198 Other specified symptoms and signs involving the digestive system and abdomen: Secondary | ICD-10-CM

## 2013-10-01 DIAGNOSIS — K5289 Other specified noninfective gastroenteritis and colitis: Secondary | ICD-10-CM

## 2013-10-01 MED ORDER — MESALAMINE 1000 MG RE SUPP: 1000.0000 mg | Freq: Every day | RECTAL | Status: DC

## 2013-10-01 MED ORDER — SODIUM CHLORIDE 0.9 % IV SOLN: 500.0000 mL | INTRAVENOUS | Status: DC

## 2013-10-01 NOTE — Op Note (Signed)
[place]  [address]. [place], [address]   COLONOSCOPY PROCEDURE REPORT PATIENT: Kristen, Hopkins  MR#: [id number] BIRTHDATE: [birth year] , 64  yrs. old GENDER: Female ENDOSCOPIST: Ladene Artist, MD, [facility] REFERRED AJ:OINO Parke Simmers, M.D. PROCEDURE DATE:  10/01/2013 PROCEDURE:   Colonoscopy with biopsy First Screening Colonoscopy - Avg.  risk and is 50 yrs.  old or older - No.  Prior Negative Screening - Now for repeat screening. N/A  History of Adenoma - Now for follow-up colonoscopy & has been > or = to 3 yrs.  N/A  Polyps Removed Today? No.  Recommend repeat exam, <10 yrs? No. ASA CLASS:   Class II INDICATIONS:Fecal urgency, change in bowel habits. MEDICATIONS: MAC sedation, administered by CRNA and propofol (Diprivan) 385m IV DESCRIPTION OF PROCEDURE:   After the risks benefits and alternatives of the procedure were thoroughly explained, informed consent was obtained.  A digital rectal exam revealed no abnormalities of the rectum.   The LB [id number] endoscope was introduced through the anus and advanced to the cecum, which was identified by both the appendix and ileocecal valve. No adverse events experienced.   The quality of the prep was Prepopik good The instrument was then slowly withdrawn as the colon was fully examined.  COLON FINDINGS: A circumferential patch of abnormal mucosa was found in the mid to distal rectum.  The mucosa was friable,rythematous and had granularity and loss of vascularity.  Multiple biopsies of the area were performed.   Mild diverticulosis was noted in the ascending colon and transverse colon.   Moderate diverticulosis was noted in the descending colon and sigmoid colon.   The colon was otherwise normal.  There was no diverticulosis, inflammation, polyps or cancers unless previously stated.  Retroflexed views revealed no abnormalities. The time to cecum=5 minutes 44 seconds. Withdrawal time=14 minutes 20  seconds.  The scope was withdrawn and the procedure completed. COMPLICATIONS: There were no complications. ENDOSCOPIC IMPRESSION: 1.   Abnormal mucosa was found in the rectum; multiple biopsies performed 2.   Mild diverticulosis  in the ascending colon and transverse colon 3.   Moderate diverticulosis in the descending colon and sigmoid colon RECOMMENDATIONS: 1.  Await pathology results 2.  High fiber diet with liberal fluid intake. 3.  You should continue to follow colorectal cancer screening guidelines for "routine risk" patients with a repeat colonoscopy in 10 years.  There is no need for routine, screening FOBT (stool) testing for at least 5 years. 4.  Canasa 1000 mg PR daily, #30, 5 refills 5.  Office appt in 4-6 weeks  eSigned:  MLadene Artist MD, FDepartment Of [facility]05/12/2013 1:52 PM

## 2013-10-01 NOTE — Patient Instructions (Signed)
YOU HAD AN ENDOSCOPIC PROCEDURE TODAY AT [facility]: Refer to the procedure report that was given to you for any specific questions about what was found during the examination.  If the procedure report does not answer your questions, please call your gastroenterologist to clarify.  If you requested that your care partner not be given the details of your procedure findings, then the procedure report has been included in a sealed envelope for you to review at your convenience later.  YOU SHOULD EXPECT: Some feelings of bloating in the abdomen. Passage of more gas than usual.  Walking can help get rid of the air that was put into your GI tract during the procedure and reduce the bloating. If you had a lower endoscopy (such as a colonoscopy or flexible sigmoidoscopy) you may notice spotting of blood in your stool or on the toilet paper. If you underwent a bowel prep for your procedure, then you may not have a normal bowel movement for a few days.  DIET: Your first meal following the procedure should be a light meal and then it is ok to progress to your normal diet.  A half-sandwich or bowl of soup is an example of a good first meal.  Heavy or fried foods are harder to digest and may make you feel nauseous or bloated.  Likewise meals heavy in dairy and vegetables can cause extra gas to form and this can also increase the bloating.  Drink plenty of fluids but you should avoid alcoholic beverages for 24 hours.  ACTIVITY: Your care partner should take you home directly after the procedure.  You should plan to take it easy, moving slowly for the rest of the day.  You can resume normal activity the day after the procedure however you should NOT DRIVE or use heavy machinery for 24 hours (because of the sedation medicines used during the test).    SYMPTOMS TO REPORT IMMEDIATELY: A gastroenterologist can be reached at any hour.  During normal business hours, 8:30 AM to 5:00 PM Monday through Friday,  call [phone].  After hours and on weekends, please call the GI answering service at [phone] who will take a message and have the physician on call contact you.   Following lower endoscopy (colonoscopy or flexible sigmoidoscopy):  Excessive amounts of blood in the stool  Significant tenderness or worsening of abdominal pains  Swelling of the abdomen that is new, acute  Fever of 100F or higher   FOLLOW UP: If any biopsies were taken you will be contacted by phone or by letter within the next 1-3 weeks.  Call your gastroenterologist if you have not heard about the biopsies in 3 weeks.  Our staff will call the home number listed on your records the next business day following your procedure to check on you and address any questions or concerns that you may have at that time regarding the information given to you following your procedure. This is a courtesy call and so if there is no answer at the home number and we have not heard from you through the emergency physician on call, we will assume that you have returned to your regular daily activities without incident.  SIGNATURES/CONFIDENTIALITY: You and/or your care partner have signed paperwork which will be entered into your electronic medical record.  These signatures attest to the fact that that the information above on your After Visit Summary has been reviewed and is understood.  Full responsibility of the confidentiality of  this discharge information lies with you and/or your care-partner.  Diverticulosis, proctitis, high fiber diet-handouts given  Repeat colonoscopy in 10 years.  Canasa 1000 mg daily   Office follow-up in 4-6 weeks, office will call to schedule this appt.

## 2013-10-01 NOTE — Progress Notes (Signed)
  [facility] Post-op Note  Patient: Kristen Hopkins  Procedure(s) Performed: colonoscopy  Patient Location: [facility] - Recovery Area  Anesthesia Type: Deep Sedation/Propofol  Level of Consciousness: awake, oriented and patient cooperative  Airway and Oxygen Therapy: Patient Spontanous Breathing  Post-op Pain: none  Post-op Assessment:  Post-op Vital signs reviewed, Patient's Cardiovascular Status Stable, Respiratory Function Stable, Patent Airway, No signs of Nausea or vomiting and Pain level controlled  Post-op Vital Signs: Reviewed and stable  Complications: No apparent anesthesia complications  Charne Mcbrien E Keian Odriscoll 1:50 PM

## 2013-10-04 ENCOUNTER — Telehealth: Payer: Self-pay | Admitting: *Deleted

## 2013-10-04 NOTE — Telephone Encounter (Signed)
  Follow up Call-  Call back number 10/01/2013  Post procedure Call Back phone  # [phone]  Permission to leave phone message Yes     Patient questions:  Do you have a fever, pain , or abdominal swelling? no Pain Score  0 *  Have you tolerated food without any problems? yes  Have you been able to return to your normal activities? yes  Do you have any questions about your discharge instructions: Diet   no Medications  no Follow up visit  no  Do you have questions or concerns about your Care? no  Actions: * If pain score is 4 or above: No action needed, pain <4.

## 2013-10-05 ENCOUNTER — Encounter: Payer: Self-pay | Admitting: Gastroenterology

## 2013-10-13 ENCOUNTER — Other Ambulatory Visit: Payer: Self-pay | Admitting: Internal Medicine

## 2013-12-25 ENCOUNTER — Other Ambulatory Visit: Payer: Self-pay | Admitting: Internal Medicine

## 2014-01-12 ENCOUNTER — Encounter: Payer: Self-pay | Admitting: Internal Medicine

## 2014-01-18 ENCOUNTER — Other Ambulatory Visit: Payer: BC Managed Care – PPO | Admitting: Internal Medicine

## 2014-01-18 DIAGNOSIS — Z79899 Other long term (current) drug therapy: Secondary | ICD-10-CM

## 2014-01-18 DIAGNOSIS — E785 Hyperlipidemia, unspecified: Secondary | ICD-10-CM

## 2014-01-19 LAB — HEPATIC FUNCTION PANEL
ALT: 16 U/L (ref 0–35)
AST: 21 U/L (ref 0–37)
Albumin: 4.3 g/dL (ref 3.5–5.2)
Alkaline Phosphatase: 101 U/L (ref 39–117)
Bilirubin, Direct: 0.1 mg/dL (ref 0.0–0.3)
Indirect Bilirubin: 0.4 mg/dL (ref 0.2–1.2)
TOTAL PROTEIN: 7.3 g/dL (ref 6.0–8.3)
Total Bilirubin: 0.5 mg/dL (ref 0.2–1.2)

## 2014-01-19 LAB — LIPID PANEL
Cholesterol: 153 mg/dL (ref 0–200)
HDL: 73 mg/dL (ref >39)
LDL CALC: 57 mg/dL (ref 0–99)
TRIGLYCERIDES: 116 mg/dL (ref <150)
Total CHOL/HDL Ratio: 2.1 Ratio
VLDL: 23 mg/dL (ref 0–40)

## 2014-01-20 ENCOUNTER — Encounter: Payer: Self-pay | Admitting: Internal Medicine

## 2014-01-20 ENCOUNTER — Ambulatory Visit (INDEPENDENT_AMBULATORY_CARE_PROVIDER_SITE_OTHER): Payer: BC Managed Care – PPO | Admitting: Internal Medicine

## 2014-01-20 VITALS — BP 128/64 | HR 76 | Temp 97.7°F | Wt 140.0 lb

## 2014-01-20 DIAGNOSIS — I10 Essential (primary) hypertension: Secondary | ICD-10-CM

## 2014-01-20 DIAGNOSIS — M542 Cervicalgia: Secondary | ICD-10-CM

## 2014-01-20 DIAGNOSIS — E785 Hyperlipidemia, unspecified: Secondary | ICD-10-CM

## 2014-01-20 MED ORDER — CYCLOBENZAPRINE HCL 10 MG PO TABS: ORAL_TABLET | ORAL | Status: DC

## 2014-01-20 NOTE — Progress Notes (Signed)
   Subjective:    [name], female    DOB: 1940/10/20, 73 y.o.   MRN: [id number]  HPI For six-month recheck. Lipid panel liver functions normal on statin medication. Blood pressure is excellent. Been having some issues with neck pain and crunching sound in her neck.    Review of Systems     Objective:   Physical Exam Palpable muscle spasm left trapezius extending into sternocleidomastoid muscle. Chest clear. Cardiac exam regular rate and rhythm. Extremities without edema       Assessment & Plan:  Neck pain-has left trapezius muscle spasm  Hypertension-stable on current regimen  Hyperlipidemia-stable on current regimen  Plan: Flexeril 10 mg #30 with one refill one half to 1 tablet at bedtime when necessary. Ice neck down for 30 minutes daily. Do not sit too long without moving shoulders and rotating neck. Return in 6 months for physical examination. Continue same chronic medications. Declines influenza immunization.

## 2014-01-20 NOTE — Patient Instructions (Signed)
Patient declines flu vaccine. Return in 6 months for physical exam. Flexeril as needed for neck pain. Ice neck down for 20 minutes daily. Do not sit too long without moving shoulders and rotating head

## 2014-01-23 ENCOUNTER — Other Ambulatory Visit: Payer: Self-pay | Admitting: Internal Medicine

## 2014-04-10 ENCOUNTER — Other Ambulatory Visit: Payer: Self-pay | Admitting: Internal Medicine

## 2014-06-02 ENCOUNTER — Other Ambulatory Visit: Payer: Self-pay | Admitting: Gastroenterology

## 2014-06-03 ENCOUNTER — Other Ambulatory Visit: Payer: Self-pay

## 2014-07-17 ENCOUNTER — Other Ambulatory Visit: Payer: Self-pay | Admitting: Internal Medicine

## 2014-07-26 ENCOUNTER — Other Ambulatory Visit: Payer: BLUE CROSS/BLUE SHIELD | Admitting: Internal Medicine

## 2014-07-26 DIAGNOSIS — Z1329 Encounter for screening for other suspected endocrine disorder: Secondary | ICD-10-CM

## 2014-07-26 DIAGNOSIS — Z1321 Encounter for screening for nutritional disorder: Secondary | ICD-10-CM

## 2014-07-26 DIAGNOSIS — Z13220 Encounter for screening for lipoid disorders: Secondary | ICD-10-CM

## 2014-07-26 DIAGNOSIS — Z130 Encounter for screening for diseases of the blood and blood-forming organs and certain disorders involving the immune mechanism: Secondary | ICD-10-CM

## 2014-07-26 DIAGNOSIS — Z0000 Encounter for general adult medical examination without abnormal findings: Secondary | ICD-10-CM

## 2014-07-26 LAB — LIPID PANEL
CHOLESTEROL: 146 mg/dL (ref 0–200)
HDL: 75 mg/dL (ref >=46)
LDL Cholesterol: 47 mg/dL (ref 0–99)
TRIGLYCERIDES: 120 mg/dL (ref <150)
Total CHOL/HDL Ratio: 1.9 Ratio
VLDL: 24 mg/dL (ref 0–40)

## 2014-07-26 LAB — CBC WITH DIFFERENTIAL/PLATELET
BASOS ABS: 0.1 10*3/uL (ref 0.0–0.1)
BASOS PCT: 1 % (ref 0–1)
EOS ABS: 0.1 10*3/uL (ref 0.0–0.7)
Eosinophils Relative: 1 % (ref 0–5)
HEMATOCRIT: 36 % (ref 36.0–46.0)
Hemoglobin: 11.6 g/dL — ABNORMAL LOW (ref 12.0–15.0)
Lymphocytes Relative: 30 % (ref 12–46)
Lymphs Abs: 3.1 10*3/uL (ref 0.7–4.0)
MCH: 30.4 pg (ref 26.0–34.0)
MCHC: 32.2 g/dL (ref 30.0–36.0)
MCV: 94.2 fL (ref 78.0–100.0)
MPV: 10.7 fL (ref 8.6–12.4)
Monocytes Absolute: 0.7 10*3/uL (ref 0.1–1.0)
Monocytes Relative: 7 % (ref 3–12)
NEUTROS ABS: 6.2 10*3/uL (ref 1.7–7.7)
Neutrophils Relative %: 61 % (ref 43–77)
Platelets: 377 10*3/uL (ref 150–400)
RBC: 3.82 MIL/uL — ABNORMAL LOW (ref 3.87–5.11)
RDW: 13 % (ref 11.5–15.5)
WBC: 10.2 10*3/uL (ref 4.0–10.5)

## 2014-07-26 LAB — COMPREHENSIVE METABOLIC PANEL
ALBUMIN: 4.2 g/dL (ref 3.5–5.2)
ALT: 16 U/L (ref 0–35)
AST: 20 U/L (ref 0–37)
Alkaline Phosphatase: 87 U/L (ref 39–117)
BUN: 16 mg/dL (ref 6–23)
CALCIUM: 9.5 mg/dL (ref 8.4–10.5)
CO2: 29 meq/L (ref 19–32)
Chloride: 97 mEq/L (ref 96–112)
Creat: 0.69 mg/dL (ref 0.50–1.10)
GLUCOSE: 98 mg/dL (ref 70–99)
POTASSIUM: 4 meq/L (ref 3.5–5.3)
Sodium: 138 mEq/L (ref 135–145)
TOTAL PROTEIN: 6.9 g/dL (ref 6.0–8.3)
Total Bilirubin: 0.5 mg/dL (ref 0.2–1.2)

## 2014-07-26 LAB — TSH: TSH: 2.523 u[IU]/mL (ref 0.350–4.500)

## 2014-07-27 LAB — VITAMIN D 25 HYDROXY (VIT D DEFICIENCY, FRACTURES): VIT D 25 HYDROXY: 42 ng/mL (ref 30–100)

## 2014-07-28 ENCOUNTER — Encounter: Payer: Self-pay | Admitting: Internal Medicine

## 2014-07-28 ENCOUNTER — Ambulatory Visit (INDEPENDENT_AMBULATORY_CARE_PROVIDER_SITE_OTHER): Payer: BLUE CROSS/BLUE SHIELD | Admitting: Internal Medicine

## 2014-07-28 VITALS — BP 126/78 | HR 80 | Temp 97.8°F | Ht 65.0 in | Wt 148.0 lb

## 2014-07-28 DIAGNOSIS — I10 Essential (primary) hypertension: Secondary | ICD-10-CM

## 2014-07-28 DIAGNOSIS — Z8719 Personal history of other diseases of the digestive system: Secondary | ICD-10-CM

## 2014-07-28 DIAGNOSIS — E785 Hyperlipidemia, unspecified: Secondary | ICD-10-CM

## 2014-07-28 DIAGNOSIS — Z0000 Encounter for general adult medical examination without abnormal findings: Secondary | ICD-10-CM

## 2014-07-28 LAB — POCT URINALYSIS DIPSTICK
Bilirubin, UA: NEGATIVE
GLUCOSE UA: NEGATIVE
KETONES UA: NEGATIVE
Leukocytes, UA: NEGATIVE
Nitrite, UA: NEGATIVE
RBC UA: NEGATIVE
SPEC GRAV UA: 1.015
Urobilinogen, UA: NEGATIVE
pH, UA: 6.5

## 2014-07-28 NOTE — Progress Notes (Signed)
Subjective:    [name], female    DOB: Dec 08, 1940, 74 y.o.   MRN: [id number]  HPI 74 year old Female in today for health maintenance exam and evaluation of medical issues. Feels well with no complaints. She has a history of hypertension and hyperlipidemia. She'll be retiring at the end of the year. Currently working just 4 days a week and is off every Friday. In May 2015, she saw Dr. Fuller Plan with complaint of urgency of defecation. She had family history of Crohn's disease and a brother. She had a colonoscopy. She was found to have mild proctitis. Was started on Canasa. No recent symptoms.  Patient had ophthalmic migraine in 2010. Herpes zoster 2001. Received Zostavax vaccine 2011. Cardiolite study in 2007 was negative. Received tetanus and Pneumovax immunizations January 2013. Has been on estrogen replacement for many years. Status post hysterectomy with uni oophorectomy 1977. History of bilateral breast implants 1981. Surgery for herniated lumbar disc L5-S1 1998.  She cannot tolerate Maxide as she has had a photosensitivity reaction to this. Amlodipine has caused leg swelling. Doesn't like Hycodan. Can take Tussionex.  She had a negative Cardiolite study in 2007.  Social history: She is married. Has 3 adult children. Husband has worked in Tax [employer]. He is now retired. Patient quit smoking in 1998. Drinks wine socially. She works as a Herbalist. She and her husband moved here 89 from [place].  Family history: Father died of a sudden cardiac event with history of defibrillator, congestive heart failure, MI, CABG. Mother living age 77. One brother died of an MI. Another brother died of unknown causes but had history of infection and alcohol abuse. 3 brothers living one with history of Crohn's disease.  Gets mammogram at [facility]. Has not had recent mammogram. Order was faxed to [facility] for mammogram.    Review of Systems  Constitutional:  Negative.   HENT: Negative.   Respiratory: Negative.   Cardiovascular: Negative.   Gastrointestinal: Negative.   Endocrine: Negative.   Genitourinary: Negative.        History of proctitis but asymptomatic now.  Neurological: Negative.   Hematological: Negative.   Psychiatric/Behavioral: Negative.        Objective:   Physical Exam  Constitutional: She is oriented to person, place, and time. She appears well-developed and well-nourished. No distress.  HENT:  Head: Normocephalic and atraumatic.  Right Ear: External ear normal.  Left Ear: External ear normal.  Mouth/Throat: Oropharynx is clear and moist. No oropharyngeal exudate.  Eyes: Conjunctivae and EOM are normal. Pupils are equal, round, and reactive to light. Right eye exhibits no discharge. Left eye exhibits no discharge. No scleral icterus.  Neck: Neck supple. No JVD present. No thyromegaly present.  Cardiovascular: Normal rate, regular rhythm, normal heart sounds and intact distal pulses.   No murmur heard. Pulmonary/Chest: Effort normal. No respiratory distress. She has no wheezes. She has no rales. She exhibits no tenderness.  Breast: Bilateral breast implants no masses appreciated  Abdominal: Soft. Bowel sounds are normal. She exhibits no distension and no mass. There is no tenderness. There is no rebound and no guarding.  Genitourinary:  Deferred status post hysterectomy  Musculoskeletal: Normal range of motion. She exhibits no edema.  Lymphadenopathy:    She has no cervical adenopathy.  Neurological: She is alert and oriented to person, place, and time. She has normal reflexes. She displays normal reflexes. No cranial nerve deficit. Coordination normal.  Skin: Skin is warm and dry.  No rash noted. She is not diaphoretic. No erythema.  Psychiatric: She has a normal mood and affect. Her behavior is normal. Judgment and thought content normal.  Vitals reviewed.         Assessment & Plan:  Normal health maintenance    History of proctitis seen by Dr. Raquel Sarna asymptomatic  Hypertension-stable on current regimen  Hyperlipidemia-lipid panel normal  Plan: Encourage  To have mammogram. Order placed was [place]. Will need Prevnar in the near future. Otherwise immunizations are up-to-date. Return in 6 months.  Subjective:   NOT on Medicare at this time:  Review Past Medical/Family/Social: see above   Risk Factors  Current exercise habits: walking Dietary issues discussed: low fat low carb  Cardiac risk factors: HTN, Family history, hyperlipidemia  Depression Screen  (Note: if answer to either of the following is "Yes", a more complete depression screening is indicated)  Over the past two weeks, have you felt down, depressed or hopeless? Yes  Over the past two weeks, have you felt little interest or pleasure in doing things? Yes  Have you lost interest or pleasure in daily life? Yes  Do you often feel hopeless? Yes  Do you cry easily over simple problems? No   Activities of Daily Living  In your present state of health, do you have any difficulty performing the following activities?:  Driving? No  Managing money? No  Feeding yourself? No  Getting from bed to chair? No  Climbing a flight of stairs? No  Preparing food and eating?: No  Bathing or showering? No  Getting dressed: No  Getting to the toilet? No  Using the toilet:No  Moving around from place to place: No  In the past year have you fallen or had a near fall?:No  Are you sexually active?  Not asked Do you have more than one partner? No   Hearing Difficulties: No  Do you often ask people to speak up or repeat themselves? No  Do you experience ringing or noises in your ears? Yes  Do you have difficulty understanding soft or whispered voices? No  Do you feel that you have a problem with memory? Yes  Do you often misplace items? No  Do you feel safe at home? Yes   Cognitive Testing  Alert? Yes Normal Appearance?Yes    Oriented to person? Yes Place? Yes  Time? Yes  Recall of three objects? Yes  Can perform simple calculations? Yes  Displays appropriate judgment?Yes  Can read the correct time from a watch face?Yes   List the Names of Other Physician/Practitioners you currently use: None  Screening Tests / Date---- see above Colonoscopy                     Zostavax  Mammogram  Influenza Vaccine  Tetanus/tdap   Objective:       General appearance: Appears stated age  Head: Normocephalic, without obvious abnormality, atraumatic  Eyes: conj clear, EOMi PEERLA  Ears: normal TM's and external ear canals both ears  Nose: Nares normal. Septum midline. Mucosa normal. No drainage or sinus tenderness.  Throat: lips, mucosa, and tongue normal; teeth and gums normal  Neck: no adenopathy, no carotid bruit, no JVD, supple, symmetrical, trachea midline and thyroid not enlarged, symmetric, no tenderness/mass/nodules  No CVA tenderness.  Lungs: clear to auscultation bilaterally  Breasts: normal appearance, no masses or tenderness-bilateral implants Heart: regular rate and rhythm, S1, S2 normal, no murmur, click, rub or gallop  Abdomen: soft, non-tender; bowel  sounds normal; no masses, no organomegaly  Musculoskeletal: ROM normal in all joints, no crepitus, no deformity, Normal muscle strengthen. Back  is symmetric, no curvature. Skin: Skin color, texture, turgor normal. No rashes or lesions  Lymph nodes: Cervical, supraclavicular, and axillary nodes normal.  Neurologic: CN 2 -12 Normal, Normal symmetric reflexes. Normal coordination and gait  Psych: Alert & Oriented x 3, Mood appear stable.    Assessment:   Hypertension  Hyperlipidemia  History of proctitis  All of these medical issues are stable and well controlled with current regimen  Plan:    During the course of the visit the patient was educated and counseled about appropriate screening and preventive services including:  Screening  mammography  Colorectal cancer screening - done 2015  Diet review for nutrition referral? Yes ____ Not Indicated __x__   I have personally reviewed:  The patient's medical and social history  Their use of alcohol, tobacco or illicit drugs  Their current medications and supplements  The patient's functional ability including ADLs,fall risks, home safety risks, cognitive, and hearing and visual impairment  Diet and physical activities    !

## 2014-07-28 NOTE — Patient Instructions (Signed)
Continue same medications and return in 6 months. Have mammogram.

## 2014-08-02 ENCOUNTER — Other Ambulatory Visit: Payer: Self-pay | Admitting: Gastroenterology

## 2014-08-02 MED ORDER — MESALAMINE 1000 MG RE SUPP: 1000.0000 mg | Freq: Every day | RECTAL | Status: DC

## 2014-09-03 ENCOUNTER — Other Ambulatory Visit: Payer: Self-pay | Admitting: Internal Medicine

## 2014-09-21 ENCOUNTER — Telehealth: Payer: Self-pay | Admitting: Gastroenterology

## 2014-09-21 MED ORDER — MESALAMINE 1000 MG RE SUPP: 1000.0000 mg | Freq: Every day | RECTAL | Status: DC

## 2014-09-21 NOTE — Telephone Encounter (Signed)
Prescription sent to patient's pharmacy.

## 2014-10-09 ENCOUNTER — Other Ambulatory Visit: Payer: Self-pay | Admitting: Internal Medicine

## 2014-10-15 ENCOUNTER — Other Ambulatory Visit: Payer: Self-pay | Admitting: Internal Medicine

## 2014-10-18 ENCOUNTER — Encounter: Payer: Self-pay | Admitting: Internal Medicine

## 2014-11-21 ENCOUNTER — Other Ambulatory Visit: Payer: Self-pay

## 2015-01-08 ENCOUNTER — Other Ambulatory Visit: Payer: Self-pay | Admitting: Internal Medicine

## 2015-01-15 ENCOUNTER — Other Ambulatory Visit: Payer: Self-pay | Admitting: Internal Medicine

## 2015-01-29 ENCOUNTER — Other Ambulatory Visit: Payer: Self-pay | Admitting: Internal Medicine

## 2015-01-31 ENCOUNTER — Other Ambulatory Visit: Payer: BLUE CROSS/BLUE SHIELD | Admitting: Internal Medicine

## 2015-02-02 ENCOUNTER — Ambulatory Visit: Payer: BLUE CROSS/BLUE SHIELD | Admitting: Internal Medicine

## 2015-02-09 ENCOUNTER — Other Ambulatory Visit: Payer: Self-pay | Admitting: Internal Medicine

## 2015-02-09 ENCOUNTER — Other Ambulatory Visit: Payer: 59 | Admitting: Internal Medicine

## 2015-02-09 DIAGNOSIS — Z79899 Other long term (current) drug therapy: Secondary | ICD-10-CM

## 2015-02-09 DIAGNOSIS — E785 Hyperlipidemia, unspecified: Secondary | ICD-10-CM

## 2015-02-09 LAB — LIPID PANEL
CHOL/HDL RATIO: 1.9 ratio (ref <=5.0)
Cholesterol: 139 mg/dL (ref 125–200)
HDL: 73 mg/dL (ref >=46)
LDL CALC: 47 mg/dL (ref <130)
Triglycerides: 96 mg/dL (ref <150)
VLDL: 19 mg/dL (ref <30)

## 2015-02-09 LAB — HEPATIC FUNCTION PANEL
ALBUMIN: 4.3 g/dL (ref 3.6–5.1)
ALT: 18 U/L (ref 6–29)
AST: 21 U/L (ref 10–35)
Alkaline Phosphatase: 108 U/L (ref 33–130)
BILIRUBIN TOTAL: 0.4 mg/dL (ref 0.2–1.2)
Bilirubin, Direct: 0.1 mg/dL (ref <=0.2)
Indirect Bilirubin: 0.3 mg/dL (ref 0.2–1.2)
Total Protein: 7.3 g/dL (ref 6.1–8.1)

## 2015-02-16 ENCOUNTER — Ambulatory Visit (INDEPENDENT_AMBULATORY_CARE_PROVIDER_SITE_OTHER): Payer: 59 | Admitting: Internal Medicine

## 2015-02-16 ENCOUNTER — Encounter: Payer: Self-pay | Admitting: Internal Medicine

## 2015-02-16 VITALS — BP 120/74 | HR 84 | Temp 98.0°F | Ht 65.0 in | Wt 146.0 lb

## 2015-02-16 DIAGNOSIS — E785 Hyperlipidemia, unspecified: Secondary | ICD-10-CM | POA: Diagnosis not present

## 2015-02-16 DIAGNOSIS — R21 Rash and other nonspecific skin eruption: Secondary | ICD-10-CM

## 2015-02-16 DIAGNOSIS — I10 Essential (primary) hypertension: Secondary | ICD-10-CM

## 2015-02-16 MED ORDER — TRIAMCINOLONE ACETONIDE 0.1 % EX CREA: 1.0000 "application " | TOPICAL_CREAM | Freq: Three times a day (TID) | CUTANEOUS | Status: DC

## 2015-02-16 NOTE — Progress Notes (Signed)
   Subjective:    [name], female    DOB: 01-Aug-1940, 74 y.o.   MRN: [id number]  HPI In today for six-month recheck on essential hypertension and hyperlipidemia. Lipid panel and liver functions are entirely within normal limits. Blood pressure is under excellent control. Only new complaint is a rash left medial ankle which is been present for several weeks. It has been scaly and is erythematous but not papular. Doesn't recall any particular exposure to poison ivy or insect bites. Has not had anything like this previously.    Review of Systems     Objective:   Physical Exam  Circular macular area with hyperpigmentation left medial ankle approximately 2 x 2 centimeters in size. It blanches.  Chest clear to auscultation. Cardiac exam regular rate and rhythm normal S1 and S2. Neck is supple without JVD thyromegaly or carotid bruits. Extremities without pitting edema.      Assessment & Plan:  Central hypertension-stable on current regimen. Continue same medications  Hyperlipidemia-normal lipid panel and liver functions. Continue statin medication  Rash on leg-history of scaling and hyperpigmentation--- could this be? Eczema versus contact dermatitis. Try triamcinolone cream 3 times daily for 2-4 weeks. If no improvement, see dermatologist  Plan: Continue same medications and return in 6 months for physical exam

## 2015-02-16 NOTE — Patient Instructions (Signed)
Apply triamcinolone cream to left medial ankle 3 times daily for 1 month. If no improvement, call dermatologist. Continue same medications as previously prescribed. Return in 6 months for physical exam. Flu vaccine declined. Bone density declined.

## 2015-04-11 ENCOUNTER — Other Ambulatory Visit: Payer: Self-pay | Admitting: Gastroenterology

## 2015-04-11 ENCOUNTER — Other Ambulatory Visit: Payer: Self-pay | Admitting: Internal Medicine

## 2015-04-18 ENCOUNTER — Telehealth: Payer: Self-pay | Admitting: Gastroenterology

## 2015-04-18 MED ORDER — MESALAMINE 1000 MG RE SUPP: 1000.0000 mg | Freq: Every day | RECTAL | Status: DC

## 2015-04-18 NOTE — Telephone Encounter (Signed)
Informed patient that she is due for an office visit and I sent the suppositories to her pharmacy. Pt scheduled appt for 04/25/15 at 2:15pm.

## 2015-04-25 ENCOUNTER — Encounter: Payer: Self-pay | Admitting: Gastroenterology

## 2015-04-25 ENCOUNTER — Ambulatory Visit (INDEPENDENT_AMBULATORY_CARE_PROVIDER_SITE_OTHER): Payer: 59 | Admitting: Gastroenterology

## 2015-04-25 VITALS — BP 138/80 | HR 64 | Ht 65.0 in | Wt 145.6 lb

## 2015-04-25 DIAGNOSIS — K5120 Ulcerative (chronic) proctitis without complications: Secondary | ICD-10-CM

## 2015-04-25 DIAGNOSIS — R152 Fecal urgency: Secondary | ICD-10-CM | POA: Diagnosis not present

## 2015-04-25 MED ORDER — MESALAMINE 1000 MG RE SUPP: 1000.0000 mg | Freq: Every day | RECTAL | Status: DC

## 2015-04-25 MED ORDER — DICYCLOMINE HCL 10 MG PO CAPS
10.0000 mg | ORAL_CAPSULE | Freq: Three times a day (TID) | ORAL | Status: DC
Start: 2015-04-25 — End: 2015-08-07

## 2015-04-25 NOTE — Patient Instructions (Signed)
We have sent the following medications to your pharmacy for you to pick up at your convenience:Canasa and Bentyl.  Thank you for choosing me and [facility].  Pricilla Riffle. Dagoberto Ligas., MD., Marval Regal

## 2015-04-25 NOTE — Progress Notes (Signed)
    History of Present Illness: This is a 74 year old female returning for follow-up of ulcerative proctitis. She relates intermittent problems with fecal urgency that often occur within an hour meal. On these days she will have 1-2 bowel movements per day. The symptoms are unpredictable. She has only one bowel movement per day with no urgency complaints. She denies rectal bleeding, abdominal pain, rectal pain.  Colonoscopy 09/2013 1. Abnormal mucosa was found in the rectum; multiple biopsies performed-CHRONIC MILDLY ACTIVE COLITIS. NEGATIVE FOR DYSPLASIA 2. Mild diverticulosis in the ascending colon and transverse colon 3. Moderate diverticulosis in the descending colon and sigmoid colon  Current Medications, Allergies, Past Medical History, Past Surgical History, Family History and Social History were reviewed in [employer] record.  Physical Exam: General: Well developed, well nourished, no acute distress Head: Normocephalic and atraumatic Eyes:  sclerae anicteric, EOMI Ears: Normal auditory acuity Mouth: No deformity or lesions Lungs: Clear throughout to auscultation Heart: Regular rate and rhythm; no murmurs, rubs or bruits Abdomen: Soft, non tender and non distended. No masses, hepatosplenomegaly or hernias noted. Normal Bowel sounds Musculoskeletal: Symmetrical with no gross deformities  Pulses:  Normal pulses noted Extremities: No clubbing, cyanosis, edema or deformities noted Neurological: Alert oriented x 4, grossly nonfocal Psychological:  Alert and cooperative. Normal mood and affect  Assessment and Recommendations:  1. Ulcerative proctitis and fecal urgency. Symptoms could be related to ulcerative proctitis or possibly irritable bowel syndrome. Continue Canasa 1000 mg suppositories at bedtime. Consider beginning oral 5-ASA. Trial of dicyclomine 10 mg 3 times a day before meals. REV in 1 year.

## 2015-05-09 ENCOUNTER — Other Ambulatory Visit: Payer: Self-pay | Admitting: Internal Medicine

## 2015-05-16 ENCOUNTER — Other Ambulatory Visit: Payer: Self-pay | Admitting: Internal Medicine

## 2015-08-01 ENCOUNTER — Other Ambulatory Visit: Payer: Medicare Other | Admitting: Internal Medicine

## 2015-08-01 DIAGNOSIS — Z0000 Encounter for general adult medical examination without abnormal findings: Secondary | ICD-10-CM | POA: Diagnosis not present

## 2015-08-01 DIAGNOSIS — E785 Hyperlipidemia, unspecified: Secondary | ICD-10-CM | POA: Diagnosis not present

## 2015-08-01 DIAGNOSIS — I10 Essential (primary) hypertension: Secondary | ICD-10-CM | POA: Diagnosis not present

## 2015-08-01 DIAGNOSIS — Z130 Encounter for screening for diseases of the blood and blood-forming organs and certain disorders involving the immune mechanism: Secondary | ICD-10-CM | POA: Diagnosis not present

## 2015-08-01 DIAGNOSIS — Z1321 Encounter for screening for nutritional disorder: Secondary | ICD-10-CM

## 2015-08-01 DIAGNOSIS — Z1329 Encounter for screening for other suspected endocrine disorder: Secondary | ICD-10-CM

## 2015-08-01 LAB — CBC WITH DIFFERENTIAL/PLATELET
BASOS PCT: 1 % (ref 0–1)
Basophils Absolute: 0.1 10*3/uL (ref 0.0–0.1)
Eosinophils Absolute: 0.2 10*3/uL (ref 0.0–0.7)
Eosinophils Relative: 2 % (ref 0–5)
HCT: 36.4 % (ref 36.0–46.0)
HEMOGLOBIN: 12.1 g/dL (ref 12.0–15.0)
LYMPHS ABS: 3.1 10*3/uL (ref 0.7–4.0)
Lymphocytes Relative: 32 % (ref 12–46)
MCH: 31.2 pg (ref 26.0–34.0)
MCHC: 33.2 g/dL (ref 30.0–36.0)
MCV: 93.8 fL (ref 78.0–100.0)
MONO ABS: 0.7 10*3/uL (ref 0.1–1.0)
MPV: 10.2 fL (ref 8.6–12.4)
Monocytes Relative: 7 % (ref 3–12)
NEUTROS ABS: 5.6 10*3/uL (ref 1.7–7.7)
NEUTROS PCT: 58 % (ref 43–77)
Platelets: 367 10*3/uL (ref 150–400)
RBC: 3.88 MIL/uL (ref 3.87–5.11)
RDW: 13.6 % (ref 11.5–15.5)
WBC: 9.6 10*3/uL (ref 4.0–10.5)

## 2015-08-01 LAB — LIPID PANEL
CHOLESTEROL: 171 mg/dL (ref 125–200)
HDL: 79 mg/dL (ref >=46)
LDL Cholesterol: 74 mg/dL (ref <130)
Total CHOL/HDL Ratio: 2.2 Ratio (ref <=5.0)
Triglycerides: 90 mg/dL (ref <150)
VLDL: 18 mg/dL (ref <30)

## 2015-08-01 LAB — COMPLETE METABOLIC PANEL WITH GFR
ALBUMIN: 4.5 g/dL (ref 3.6–5.1)
ALK PHOS: 157 U/L — AB (ref 33–130)
ALT: 33 U/L — ABNORMAL HIGH (ref 6–29)
AST: 28 U/L (ref 10–35)
BILIRUBIN TOTAL: 0.4 mg/dL (ref 0.2–1.2)
BUN: 17 mg/dL (ref 7–25)
CO2: 32 mmol/L — ABNORMAL HIGH (ref 20–31)
Calcium: 10.5 mg/dL — ABNORMAL HIGH (ref 8.6–10.4)
Chloride: 97 mmol/L — ABNORMAL LOW (ref 98–110)
Creat: 0.75 mg/dL (ref 0.60–0.93)
GFR, Est African American: >89 mL/min (ref >=60)
GFR, Est Non African American: 80 mL/min (ref >=60)
GLUCOSE: 95 mg/dL (ref 65–99)
POTASSIUM: 4.5 mmol/L (ref 3.5–5.3)
SODIUM: 140 mmol/L (ref 135–146)
TOTAL PROTEIN: 7.8 g/dL (ref 6.1–8.1)

## 2015-08-01 LAB — TSH: TSH: 3.73 m[IU]/L

## 2015-08-02 LAB — VITAMIN D 25 HYDROXY (VIT D DEFICIENCY, FRACTURES): VIT D 25 HYDROXY: 37 ng/mL (ref 30–100)

## 2015-08-03 ENCOUNTER — Ambulatory Visit (INDEPENDENT_AMBULATORY_CARE_PROVIDER_SITE_OTHER): Payer: Medicare Other | Admitting: Internal Medicine

## 2015-08-03 ENCOUNTER — Encounter: Payer: Self-pay | Admitting: Internal Medicine

## 2015-08-03 ENCOUNTER — Other Ambulatory Visit: Payer: Self-pay

## 2015-08-03 VITALS — BP 110/80 | HR 90 | Temp 97.5°F | Resp 18 | Ht 64.75 in | Wt 150.5 lb

## 2015-08-03 DIAGNOSIS — I10 Essential (primary) hypertension: Secondary | ICD-10-CM

## 2015-08-03 DIAGNOSIS — E785 Hyperlipidemia, unspecified: Secondary | ICD-10-CM | POA: Diagnosis not present

## 2015-08-03 DIAGNOSIS — R748 Abnormal levels of other serum enzymes: Secondary | ICD-10-CM

## 2015-08-03 DIAGNOSIS — Z0000 Encounter for general adult medical examination without abnormal findings: Secondary | ICD-10-CM

## 2015-08-03 DIAGNOSIS — Z8719 Personal history of other diseases of the digestive system: Secondary | ICD-10-CM | POA: Diagnosis not present

## 2015-08-03 LAB — POCT URINALYSIS DIPSTICK
Bilirubin, UA: NEGATIVE
GLUCOSE UA: NEGATIVE
Ketones, UA: NEGATIVE
LEUKOCYTES UA: NEGATIVE
NITRITE UA: NEGATIVE
PH UA: 8
PROTEIN UA: NEGATIVE
RBC UA: NEGATIVE
Spec Grav, UA: 1.015
UROBILINOGEN UA: 0.2

## 2015-08-03 MED ORDER — ESTROGENS CONJUGATED 0.625 MG PO TABS: 0.6250 mg | ORAL_TABLET | Freq: Every day | ORAL | Status: DC

## 2015-08-03 MED ORDER — ESTROGENS CONJ SYNTHETIC A 0.625 MG PO TABS: 0.6250 mg | ORAL_TABLET | Freq: Every day | ORAL | Status: DC

## 2015-08-03 NOTE — Progress Notes (Signed)
Subjective:    [name], female    DOB: [birth year], 75 y.o.   MRN: [id number]  HPI 75 year old White Female retired from work in January 2017. She and her husband are moving from their house to a new townhome near [place]. Has been busy downsizing. This is her Welcome to Medicare physical exam.  Recent lab work before physical exam shows elevation of alkaline phosphatase and SGPT. Will have repeat liver enzymes drawn in 4 weeks. She did have alcohol the night before having her blood drawn.  She has a history of essential hypertension, hyperlipidemia, GE reflux. In 2015 she saw Dr. Fuller Plan for colonoscopy because of urgency to defecate who diagnosed her with mild proctitis. Pathology showed mild chronic active colitis. She is on Canasa.  Recently she found out that her estrogen replacement was well over $300 a month. We called pharmacy. They indicated it was Tier 4 and patient had high deductible. She should speak with insurance About this. We can use generic preparation but it will be still $159 a month. She's having issues sleeping because of hot flashes. She's been on estrogen replacement along, does not want to come off of it.  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 Amlodipine has caused leg swelling. Doesn't like Hycodan but can take Tussionex.  Had negative Cardiolite study in 2007.  Social history: She is married. Has 3 adult children. Husband worked in Scientist, research (life sciences) estate in [employer]. He is now retired. Patient quit smoking in 1998. Drinks wine socially. Prior to retirement, she worked as a Herbalist. She in her husband moved here in 66 from [place].  Gets mammograms through [facility]. Had mammogram in 2016.   Family history: Father died of a sudden cardiac event with history of defibrillator, congestive heart failure, MI, CABG. Mother living at age 64. One brother died of an MI. Another brother died of unknown  causes but had history of infection and alcohol abuse. 3 brothers living one with history of Crohn's disease.  Patient had ophthalmic migraine in 2010. Had herpes zoster 2001. Received Zostavax vaccine 2011. Has been on estrogen replacement for many years. Status post hysterectomy with uni-oophorectomy 1977. History of bilateral breast implants 1981. Surgery for herniated lumbar disc L5-S1 1998. Had Prevnar June 2016.    Review of Systems insomnia due to hot flashes from lack of estrogen replacement. Otherwise negative.     Objective:   Physical Exam  Constitutional: She is oriented to person, place, and time. She appears well-developed and well-nourished. No distress.  HENT:  Head: Normocephalic and atraumatic.  Right Ear: External ear normal.  Mouth/Throat: Oropharynx is clear and moist. No oropharyngeal exudate.  Eyes: Conjunctivae and EOM are normal. Pupils are equal, round, and reactive to light. Left eye exhibits no discharge. No scleral icterus.  Neck: Neck supple. No JVD present. No thyromegaly present.  Cardiovascular: Normal rate, regular rhythm and normal heart sounds.   No murmur heard. Pulmonary/Chest: Effort normal and breath sounds normal. She has no wheezes. She has no rales.  Breasts normal female without masses. Implants noted.  Abdominal: Soft. Bowel sounds are normal. She exhibits no distension and no mass. There is no tenderness. There is no rebound and no guarding.  Genitourinary:  Pap not done due to age 75 History of hysterectomy and uni-oophorectomy  Musculoskeletal: She exhibits no edema.  Lymphadenopathy:    She has no cervical adenopathy.  Neurological: She is alert and oriented to person,  place, and time. She has normal reflexes. No cranial nerve deficit. Coordination normal.  Skin: Skin is warm and dry. No rash noted. She is not diaphoretic.  Psychiatric: She has a normal mood and affect. Her behavior is normal. Judgment and thought content normal.  Vitals  reviewed.         Assessment & Plan:  History of proctitis treated with Canasa  Hot flashes-needs to restart estrogen replacement. This is contributing to insomnia.  Essential hypertension-stable on current regimen  Hyperlipidemia  Elevated liver functions-to have repeat liver functions in 4 weeks but do not drink alcohol for 48 hours prior to lab draw.  Plan: Return in one year or as needed with the exception of lab draw in 4 weeks.   Subjective:   Patient presents for Medicare Annual/Subsequent preventive examination.  Review Past Medical/Family/Social:See above   Risk Factors  Current exercise habits: Light exercise Dietary issues discussed: Low fat low carbohydrate  Cardiac risk factors:Family history, hyperlipidemia  Depression Screen  (Note: if answer to either of the following is "Yes", a more complete depression screening is indicated)   Over the past two weeks, have you felt down, depressed or hopeless? No  Over the past two weeks, have you felt little interest or pleasure in doing things? No Have you lost interest or pleasure in daily life? No Do you often feel hopeless? No Do you cry easily over simple problems? No   Activities of Daily Living  In your present state of health, do you have any difficulty performing the following activities?:   Driving? No  Managing money? No  Feeding yourself? No  Getting from bed to chair? No  Climbing a flight of stairs? No  Preparing food and eating?: No  Bathing or showering? No  Getting dressed: No  Getting to the toilet? No  Using the toilet:No  Moving around from place to place: No  In the past year have you fallen or had a near fall?:No  Are you sexually active? yes Do you have more than one partner? No   Hearing Difficulties: No  Do you often ask people to speak up or repeat themselves? No  Do you experience ringing or noises in your ears? No  Do you have difficulty understanding soft or whispered  voices? No  Do you feel that you have a problem with memory? No Do you often misplace items? No    Home Safety:  Do you have a smoke alarm at your residence? Yes Do you have grab bars in the bathroom?No Do you have throw rugs in your house? Yes   Cognitive Testing  Alert? Yes Normal Appearance?Yes  Oriented to person? Yes Place? Yes  Time? Yes  Recall of three objects? Yes  Can perform simple calculations? Yes  Displays appropriate judgment?Yes  Can read the correct time from a watch face?Yes   List the Names of Other Physician/Practitioners you currently use:  See referral list for the physicians patient is currently seeing.  Dr. Fuller Plan is gastroenterologist   Review of Systems: As above   Objective:     General appearance: Appears stated age .Thin. Head: Normocephalic, without obvious abnormality, atraumatic  Eyes: conj clear, EOMi PEERLA  Ears: normal TM's and external ear canals both ears  Nose: Nares normal. Septum midline. Mucosa normal. No drainage or sinus tenderness.  Throat: lips, mucosa, and tongue normal; teeth and gums normal  Neck: no adenopathy, no carotid bruit, no JVD, supple, symmetrical, trachea midline and thyroid not enlarged,  symmetric, no tenderness/mass/nodules  No CVA tenderness.  Lungs: clear to auscultation bilaterally  Breasts: Bilateral breast implants Heart: regular rate and rhythm, S1, S2 normal, no murmur, click, rub or gallop  Abdomen: soft, non-tender; bowel sounds normal; no masses, no organomegaly  Musculoskeletal: ROM normal in all joints, no crepitus, no deformity, Normal muscle strengthen. Back  is symmetric, no curvature. Skin: Skin color, texture, turgor normal. No rashes or lesions  Lymph nodes: Cervical, supraclavicular, and axillary nodes normal.  Neurologic: CN 2 -12 Normal, Normal symmetric reflexes. Normal coordination and gait  Psych: Alert & Oriented x 3, Mood appear stable.    Assessment:    Annual wellness medicare  exam   Plan:    During the course of the visit the patient was educated and counseled about appropriate screening and preventive services including:  Annual mammogram      Patient Instructions (the written plan) was given to the patient.  Medicare Attestation  I have personally reviewed:  The patient's medical and social history  Their use of alcohol, tobacco or illicit drugs  Their current medications and supplements  The patient's functional ability including ADLs,fall risks, home safety risks, cognitive, and hearing and visual impairment  Diet and physical activities  Evidence for depression or mood disorders  The patient's weight, height, BMI, and visual acuity have been recorded in the chart. I have made referrals, counseling, and provided education to the patient based on review of the above and I have provided the patient with a written personalized care plan for preventive services.

## 2015-08-03 NOTE — Addendum Note (Signed)
Addended by: Beryle Quant on: 08/03/2015 04:08 PM   Modules accepted: Orders, Medications

## 2015-08-03 NOTE — Patient Instructions (Signed)
Return in 4 weeks for liver functions without lipid panel. Have no alcohol for 48 hours prior to having labs drawn. Otherwise return in one year or as needed. Continue same medications. Please make with insurance currently about her deductible with estrogen replacement.

## 2015-08-07 ENCOUNTER — Other Ambulatory Visit: Payer: Self-pay

## 2015-08-07 MED ORDER — METOPROLOL SUCCINATE ER 25 MG PO TB24: 25.0000 mg | ORAL_TABLET | Freq: Two times a day (BID) | ORAL | Status: DC

## 2015-08-07 MED ORDER — AMLODIPINE BESYLATE 5 MG PO TABS: 5.0000 mg | ORAL_TABLET | Freq: Every day | ORAL | Status: DC

## 2015-08-07 MED ORDER — ATORVASTATIN CALCIUM 10 MG PO TABS: 10.0000 mg | ORAL_TABLET | Freq: Every day | ORAL | Status: DC

## 2015-08-07 MED ORDER — LOSARTAN POTASSIUM 100 MG PO TABS: 100.0000 mg | ORAL_TABLET | Freq: Every day | ORAL | Status: DC

## 2015-08-07 MED ORDER — DICYCLOMINE HCL 10 MG PO CAPS: 10.0000 mg | ORAL_CAPSULE | Freq: Three times a day (TID) | ORAL | Status: DC

## 2015-08-07 NOTE — Telephone Encounter (Signed)
Patient requesting  Medication to be sent to [facility]

## 2015-08-23 ENCOUNTER — Other Ambulatory Visit: Payer: Self-pay

## 2015-08-23 MED ORDER — INDAPAMIDE 1.25 MG PO TABS: 1.2500 mg | ORAL_TABLET | Freq: Every day | ORAL | Status: DC

## 2015-09-05 ENCOUNTER — Other Ambulatory Visit: Payer: Medicare Other | Admitting: Internal Medicine

## 2015-09-05 DIAGNOSIS — R748 Abnormal levels of other serum enzymes: Secondary | ICD-10-CM

## 2015-09-05 LAB — HEPATIC FUNCTION PANEL
ALK PHOS: 100 U/L (ref 33–130)
ALT: 20 U/L (ref 6–29)
AST: 26 U/L (ref 10–35)
Albumin: 4.3 g/dL (ref 3.6–5.1)
BILIRUBIN DIRECT: 0.1 mg/dL (ref <=0.2)
BILIRUBIN TOTAL: 0.5 mg/dL (ref 0.2–1.2)
Indirect Bilirubin: 0.4 mg/dL (ref 0.2–1.2)
Total Protein: 7.7 g/dL (ref 6.1–8.1)

## 2015-09-06 ENCOUNTER — Telehealth: Payer: Self-pay

## 2015-09-07 NOTE — Telephone Encounter (Signed)
Erogenous

## 2015-09-18 ENCOUNTER — Ambulatory Visit (INDEPENDENT_AMBULATORY_CARE_PROVIDER_SITE_OTHER): Payer: Medicare Other | Admitting: Internal Medicine

## 2015-09-18 VITALS — BP 114/80 | HR 72 | Wt 149.5 lb

## 2015-09-18 DIAGNOSIS — Z23 Encounter for immunization: Secondary | ICD-10-CM | POA: Diagnosis not present

## 2015-10-09 ENCOUNTER — Other Ambulatory Visit: Payer: Self-pay | Admitting: Internal Medicine

## 2015-11-13 ENCOUNTER — Other Ambulatory Visit: Payer: Self-pay | Admitting: Internal Medicine

## 2015-11-13 ENCOUNTER — Other Ambulatory Visit: Payer: Self-pay

## 2015-11-13 MED ORDER — ESTROGENS CONJUGATED 0.625 MG PO TABS: 0.6250 mg | ORAL_TABLET | Freq: Every day | ORAL | Status: DC

## 2016-01-09 ENCOUNTER — Other Ambulatory Visit: Payer: Self-pay | Admitting: Internal Medicine

## 2016-04-21 ENCOUNTER — Other Ambulatory Visit: Payer: Self-pay | Admitting: Internal Medicine

## 2016-04-21 NOTE — Telephone Encounter (Signed)
Please contact patient and book physical exam after 08/02/2016. Okay to refill medications 07/27/2016 after booking physical

## 2016-05-19 ENCOUNTER — Other Ambulatory Visit: Payer: Self-pay | Admitting: Internal Medicine

## 2016-05-28 ENCOUNTER — Other Ambulatory Visit: Payer: Self-pay | Admitting: Internal Medicine

## 2016-05-28 MED ORDER — DICYCLOMINE HCL 10 MG PO CAPS: ORAL_CAPSULE | ORAL | 0 refills | Status: DC

## 2016-05-28 NOTE — Telephone Encounter (Signed)
Patient called requesting bentyl refill [facility].  Rx filled and annual scheduled.

## 2016-06-18 DIAGNOSIS — H52203 Unspecified astigmatism, bilateral: Secondary | ICD-10-CM | POA: Diagnosis not present

## 2016-06-18 DIAGNOSIS — H524 Presbyopia: Secondary | ICD-10-CM | POA: Diagnosis not present

## 2016-06-18 DIAGNOSIS — H5203 Hypermetropia, bilateral: Secondary | ICD-10-CM | POA: Diagnosis not present

## 2016-06-18 DIAGNOSIS — H2513 Age-related nuclear cataract, bilateral: Secondary | ICD-10-CM | POA: Diagnosis not present

## 2016-07-01 ENCOUNTER — Other Ambulatory Visit: Payer: Self-pay | Admitting: Internal Medicine

## 2016-07-01 ENCOUNTER — Other Ambulatory Visit: Payer: Self-pay

## 2016-07-01 MED ORDER — AMLODIPINE BESYLATE 5 MG PO TABS: 5.0000 mg | ORAL_TABLET | Freq: Every day | ORAL | 0 refills | Status: DC

## 2016-07-04 ENCOUNTER — Other Ambulatory Visit: Payer: Self-pay

## 2016-07-04 MED ORDER — ATORVASTATIN CALCIUM 10 MG PO TABS: 10.0000 mg | ORAL_TABLET | Freq: Every day | ORAL | 1 refills | Status: DC

## 2016-08-01 ENCOUNTER — Other Ambulatory Visit: Payer: Self-pay | Admitting: Internal Medicine

## 2016-08-05 ENCOUNTER — Other Ambulatory Visit: Payer: Medicare Other | Admitting: Internal Medicine

## 2016-08-05 DIAGNOSIS — I10 Essential (primary) hypertension: Secondary | ICD-10-CM

## 2016-08-05 DIAGNOSIS — Z1321 Encounter for screening for nutritional disorder: Secondary | ICD-10-CM | POA: Diagnosis not present

## 2016-08-05 DIAGNOSIS — Z0000 Encounter for general adult medical examination without abnormal findings: Secondary | ICD-10-CM | POA: Diagnosis not present

## 2016-08-05 DIAGNOSIS — Z1329 Encounter for screening for other suspected endocrine disorder: Secondary | ICD-10-CM

## 2016-08-05 DIAGNOSIS — E785 Hyperlipidemia, unspecified: Secondary | ICD-10-CM | POA: Diagnosis not present

## 2016-08-05 LAB — CBC WITH DIFFERENTIAL/PLATELET
BASOS ABS: 97 {cells}/uL (ref 0–200)
Basophils Relative: 1 %
EOS ABS: 291 {cells}/uL (ref 15–500)
Eosinophils Relative: 3 %
HEMATOCRIT: 36.9 % (ref 35.0–45.0)
Hemoglobin: 12.1 g/dL (ref 11.7–15.5)
LYMPHS PCT: 37 %
Lymphs Abs: 3589 cells/uL (ref 850–3900)
MCH: 30.9 pg (ref 27.0–33.0)
MCHC: 32.8 g/dL (ref 32.0–36.0)
MCV: 94.1 fL (ref 80.0–100.0)
MONO ABS: 776 {cells}/uL (ref 200–950)
MPV: 10.3 fL (ref 7.5–12.5)
Monocytes Relative: 8 %
NEUTROS PCT: 51 %
Neutro Abs: 4947 cells/uL (ref 1500–7800)
Platelets: 348 10*3/uL (ref 140–400)
RBC: 3.92 MIL/uL (ref 3.80–5.10)
RDW: 13.1 % (ref 11.0–15.0)
WBC: 9.7 10*3/uL (ref 3.8–10.8)

## 2016-08-05 LAB — COMPREHENSIVE METABOLIC PANEL
ALBUMIN: 4.3 g/dL (ref 3.6–5.1)
ALK PHOS: 114 U/L (ref 33–130)
ALT: 17 U/L (ref 6–29)
AST: 22 U/L (ref 10–35)
BUN: 17 mg/dL (ref 7–25)
CALCIUM: 9.9 mg/dL (ref 8.6–10.4)
CO2: 31 mmol/L (ref 20–31)
Chloride: 100 mmol/L (ref 98–110)
Creat: 0.85 mg/dL (ref 0.60–0.93)
GLUCOSE: 89 mg/dL (ref 65–99)
POTASSIUM: 3.9 mmol/L (ref 3.5–5.3)
Sodium: 141 mmol/L (ref 135–146)
Total Bilirubin: 0.4 mg/dL (ref 0.2–1.2)
Total Protein: 7.6 g/dL (ref 6.1–8.1)

## 2016-08-05 LAB — LIPID PANEL
CHOL/HDL RATIO: 2.3 ratio (ref <5.0)
Cholesterol: 182 mg/dL (ref <200)
HDL: 80 mg/dL (ref >50)
LDL CALC: 70 mg/dL (ref <100)
TRIGLYCERIDES: 162 mg/dL — AB (ref <150)
VLDL: 32 mg/dL — ABNORMAL HIGH (ref <30)

## 2016-08-05 LAB — TSH: TSH: 5.76 m[IU]/L — AB

## 2016-08-06 LAB — VITAMIN D 25 HYDROXY (VIT D DEFICIENCY, FRACTURES): Vit D, 25-Hydroxy: 42 ng/mL (ref 30–100)

## 2016-08-09 ENCOUNTER — Encounter: Payer: Self-pay | Admitting: Internal Medicine

## 2016-08-09 ENCOUNTER — Ambulatory Visit (INDEPENDENT_AMBULATORY_CARE_PROVIDER_SITE_OTHER): Payer: Medicare Other | Admitting: Internal Medicine

## 2016-08-09 VITALS — BP 122/78 | HR 70 | Temp 96.7°F | Resp 18 | Ht 64.0 in | Wt 153.0 lb

## 2016-08-09 DIAGNOSIS — Z7189 Other specified counseling: Secondary | ICD-10-CM | POA: Diagnosis not present

## 2016-08-09 DIAGNOSIS — I10 Essential (primary) hypertension: Secondary | ICD-10-CM | POA: Diagnosis not present

## 2016-08-09 DIAGNOSIS — Z8719 Personal history of other diseases of the digestive system: Secondary | ICD-10-CM | POA: Diagnosis not present

## 2016-08-09 DIAGNOSIS — Z0000 Encounter for general adult medical examination without abnormal findings: Secondary | ICD-10-CM | POA: Diagnosis not present

## 2016-08-09 DIAGNOSIS — R7989 Other specified abnormal findings of blood chemistry: Secondary | ICD-10-CM

## 2016-08-09 DIAGNOSIS — E784 Other hyperlipidemia: Secondary | ICD-10-CM

## 2016-08-09 DIAGNOSIS — Z9882 Breast implant status: Secondary | ICD-10-CM

## 2016-08-09 DIAGNOSIS — R946 Abnormal results of thyroid function studies: Secondary | ICD-10-CM | POA: Diagnosis not present

## 2016-08-09 DIAGNOSIS — E7849 Other hyperlipidemia: Secondary | ICD-10-CM

## 2016-08-09 LAB — POC URINALSYSI DIPSTICK (AUTOMATED)
BILIRUBIN UA: NEGATIVE
Blood, UA: NEGATIVE
GLUCOSE UA: NEGATIVE
KETONES UA: NEGATIVE
LEUKOCYTES UA: NEGATIVE
Nitrite, UA: NEGATIVE
Protein, UA: NEGATIVE
Spec Grav, UA: 1.02 (ref 1.030–1.035)
Urobilinogen, UA: 0.2 (ref ?–2.0)
pH, UA: 6.5 (ref 5.0–8.0)

## 2016-08-09 NOTE — Progress Notes (Signed)
Subjective:    [name], female    DOB: 1940/09/22, 76 y.o.   MRN: [id number]  HPI  76 year old Female for Medicare health maintenance and evaluation of medical issues.  She has a history of hypertension and hyperlipidemia, GE reflux.  She saw Dr. Fuller Plan for colonoscopy because of urgency to defecate who diagnosed her with mild proctitis. Pathology showed mild chronic active colitis. She has been treated with Canasa.  Had local reaction to Prevnar last year with red hot swollen arm.  Has been on estrogen replacement for many years. She does not want to come off of it.  She cannot tolerate Maxide as she has a history of a photosensitivity reaction to this. Amlodipine his caused leg swelling. Doesn't like Hycodan for cough but can take Tussionex.  She had a negative Cardiolite study in 2007.  She gets mammograms through [facility].  Social history: She is married. Has 3 adult children. Husband worked in Personal assistant and incarcerated. He is now retired. She quit smoking in 1998. She drinks wine socially. Prior to retirement, she worked as a Herbalist. She and her husband moved here in 45 from [place].  Family history: Father died of a sudden cardiac arrest with history of defibrillator, congestive heart failure, MI, CABG. Mother living in her 55s. One brother died of an MI. Another brother died of unknown causes but had history of infection and alcohol abuse. 3 brothers living one with history of Crohn's disease.  Patient had ophthalmic migraine in 2010. Had herpes zoster 2001. She received the Zostavax vaccine in 2011.  Had hysterectomy with unioophorectomy 1977. History of bilateral breast implants 1981. Surgery for herniated lumbar disc L5-S1 in 1998.  She had Prevnar June 2016.  TSH is elevated. Is on generic med and needs repeat in 6-8 weeks. Has been taking with other meds.  Has been well this winter.    Review of Systems  Constitutional:  Negative.   All other systems reviewed and are negative.      Objective:   Physical Exam  Constitutional: She is oriented to person, place, and time. She appears well-developed and well-nourished. No distress.  HENT:  Head: Normocephalic and atraumatic.  Right Ear: External ear normal.  Left Ear: External ear normal.  Mouth/Throat: Oropharynx is clear and moist.  Eyes: Conjunctivae and EOM are normal. Pupils are equal, round, and reactive to light. Right eye exhibits no discharge. Left eye exhibits no discharge. No scleral icterus.  Neck: Neck supple. No JVD present. No thyromegaly present.  Cardiovascular: Normal rate, regular rhythm, normal heart sounds and intact distal pulses.   No murmur heard. Bilateral breast implants without masses  Pulmonary/Chest: Effort normal and breath sounds normal. She has no wheezes. She has no rales.  Abdominal: Soft. Bowel sounds are normal. She exhibits no distension and no mass. There is no tenderness. There is no rebound and no guarding.  Genitourinary:  Genitourinary Comments: Bimanual normal. Pap deferred due to age and hysterectomy  Musculoskeletal: She exhibits no edema.  Lymphadenopathy:    She has no cervical adenopathy.  Neurological: She is alert and oriented to person, place, and time. She has normal reflexes. No cranial nerve deficit. Coordination normal.  Skin: Skin is warm and dry. No rash noted. She is not diaphoretic.  Psychiatric: She has a normal mood and affect. Her behavior is normal. Judgment and thought content normal.  Vitals reviewed.         Assessment & Plan:  Essential hypertension-stable on current regimen  Hyperlipidemia-lipid panel shows elevation of triglycerides. Watch diet.  GE reflux  Elevated TSH-to have repeat study in April. It could be a mistake. She is not on thyroid replacement.  History of proctitis/chronic active colitis  Bilateral breast implants  History of herniated lumbar disc status post  surgery 1998  Long-term estrogen replacement-patient does not want to be off of it due to hot flashes  Plan: Continue current regimen and return in one year or as needed.  Subjective:   Patient presents for Medicare Annual/Subsequent preventive examination.  Review Past Medical/Family/Social:See above   Risk Factors  Current exercise habits: Walks some Dietary issues discussed: Low fat low carbohydrate  Cardiac risk factors:Family history, hyperlipidemia  Depression Screen  (Note: if answer to either of the following is "Yes", a more complete depression screening is indicated)   Over the past two weeks, have you felt down, depressed or hopeless? No  Over the past two weeks, have you felt little interest or pleasure in doing things? No Have you lost interest or pleasure in daily life? No Do you often feel hopeless? No Do you cry easily over simple problems? No   Activities of Daily Living  In your present state of health, do you have any difficulty performing the following activities?:   Driving? No  Managing money? No  Feeding yourself? No  Getting from bed to chair? No  Climbing a flight of stairs? No  Preparing food and eating?: No  Bathing or showering? No  Getting dressed: No  Getting to the toilet? No  Using the toilet:No  Moving around from place to place: No  In the past year have you fallen or had a near fall?:No  Are you sexually active? Yes Do you have more than one partner? No   Hearing Difficulties: No  Do you often ask people to speak up or repeat themselves? No  Do you experience ringing or noises in your ears? No  Do you have difficulty understanding soft or whispered voices? No  Do you feel that you have a problem with memory? No Do you often misplace items? No    Home Safety:  Do you have a smoke alarm at your residence? Yes Do you have grab bars in the bathroom?No Do you have throw rugs in your house? No   Cognitive Testing  Alert? Yes  Normal Appearance?Yes  Oriented to person? Yes Place? Yes  Time? Yes  Recall of three objects? Yes  Can perform simple calculations? Yes  Displays appropriate judgment?Yes  Can read the correct time from a watch face?Yes   List the Names of Other Physician/Practitioners you currently use:  See referral list for the physicians patient is currently seeing.     Review of Systems: See above   Objective:     General appearance: Appears stated age  Head: Normocephalic, without obvious abnormality, atraumatic  Eyes: conj clear, EOMi PEERLA  Ears: normal TM's and external ear canals both ears  Nose: Nares normal. Septum midline. Mucosa normal. No drainage or sinus tenderness.  Throat: lips, mucosa, and tongue normal; teeth and gums normal  Neck: no adenopathy, no carotid bruit, no JVD, supple, symmetrical, trachea midline and thyroid not enlarged, symmetric, no tenderness/mass/nodules  No CVA tenderness.  Lungs: clear to auscultation bilaterally  Breasts: normal appearance, no masses or tenderness  Heart: regular rate and rhythm, S1, S2 normal, no murmur, click, rub or gallop  Abdomen: soft, non-tender; bowel sounds normal; no masses, no  organomegaly  Musculoskeletal: ROM normal in all joints, no crepitus, no deformity, Normal muscle strengthen. Back  is symmetric, no curvature. Skin: Skin color, texture, turgor normal. No rashes or lesions  Lymph nodes: Cervical, supraclavicular, and axillary nodes normal.  Neurologic: CN 2 -12 Normal, Normal symmetric reflexes. Normal coordination and gait  Psych: Alert & Oriented x 3, Mood appear stable.    Assessment:    Annual wellness medicare exam   Plan:    During the course of the visit the patient was educated and counseled about appropriate screening and preventive services including:   Recommend annual flu vaccine and annual mammogram     Patient Instructions (the written plan) was given to the patient.  Medicare Attestation    I have personally reviewed:  The patient's medical and social history  Their use of alcohol, tobacco or illicit drugs  Their current medications and supplements  The patient's functional ability including ADLs,fall risks, home safety risks, cognitive, and hearing and visual impairment  Diet and physical activities  Evidence for depression or mood disorders  The patient's weight, height, BMI, and visual acuity have been recorded in the chart. I have made referrals, counseling, and provided education to the patient based on review of the above and I have provided the patient with a written personalized care plan for preventive services.

## 2016-08-14 NOTE — Patient Instructions (Signed)
It was pleasure to see you today. Continue same medications and return in one year or as needed. Please have mammogram.

## 2016-08-19 ENCOUNTER — Other Ambulatory Visit: Payer: Self-pay | Admitting: Internal Medicine

## 2016-09-16 ENCOUNTER — Other Ambulatory Visit: Payer: Self-pay

## 2016-09-16 MED ORDER — DICYCLOMINE HCL 10 MG PO CAPS: ORAL_CAPSULE | ORAL | 0 refills | Status: DC

## 2016-09-19 ENCOUNTER — Other Ambulatory Visit: Payer: Medicare Other | Admitting: Internal Medicine

## 2016-09-26 ENCOUNTER — Other Ambulatory Visit: Payer: Medicare Other | Admitting: Internal Medicine

## 2016-09-26 DIAGNOSIS — E039 Hypothyroidism, unspecified: Secondary | ICD-10-CM | POA: Diagnosis not present

## 2016-09-26 LAB — TSH: TSH: 5.43 mIU/L — ABNORMAL HIGH

## 2016-09-27 ENCOUNTER — Encounter: Payer: Self-pay | Admitting: Internal Medicine

## 2016-09-27 ENCOUNTER — Telehealth: Payer: Self-pay | Admitting: Internal Medicine

## 2016-09-27 MED ORDER — LEVOTHYROXINE SODIUM 50 MCG PO CAPS: 1.0000 | ORAL_CAPSULE | Freq: Every day | ORAL | 2 refills | Status: DC

## 2016-09-27 NOTE — Telephone Encounter (Signed)
Repeat TSH remains elevated. Call in prescription to [facility] at [facility] for levothyroxine 0.05 mg daily #30 with 2 refills with follow-up here in 6-8 weeks with office visit and TSH. Left patient a voicemail today but will also mail results with similar instructions. Patient to take on empty stomach 1 hour before a meal and not take with any other medications.

## 2016-09-30 ENCOUNTER — Other Ambulatory Visit: Payer: Self-pay

## 2016-09-30 DIAGNOSIS — E039 Hypothyroidism, unspecified: Secondary | ICD-10-CM

## 2016-09-30 MED ORDER — LEVOTHYROXINE SODIUM 50 MCG PO TABS: 50.0000 ug | ORAL_TABLET | Freq: Every day | ORAL | 3 refills | Status: DC

## 2016-10-14 ENCOUNTER — Other Ambulatory Visit: Payer: Self-pay | Admitting: Internal Medicine

## 2016-10-22 ENCOUNTER — Telehealth: Payer: Self-pay

## 2016-10-22 MED ORDER — AMLODIPINE BESYLATE 5 MG PO TABS: 5.0000 mg | ORAL_TABLET | Freq: Every day | ORAL | 3 refills | Status: DC

## 2016-10-22 NOTE — Telephone Encounter (Signed)
Received fax from [facility] in regards to a refill on Norvasc 64m for patient. Dr. BRenold Gentaapproved for one year and is sent electronically.

## 2016-11-19 ENCOUNTER — Other Ambulatory Visit: Payer: Medicare Other | Admitting: Internal Medicine

## 2016-11-19 ENCOUNTER — Other Ambulatory Visit: Payer: Self-pay | Admitting: Internal Medicine

## 2016-11-19 DIAGNOSIS — E0590 Thyrotoxicosis, unspecified without thyrotoxic crisis or storm: Secondary | ICD-10-CM

## 2016-11-19 LAB — TSH: TSH: 0.56 mIU/L

## 2016-11-21 ENCOUNTER — Ambulatory Visit: Payer: Medicare Other | Admitting: Internal Medicine

## 2016-11-26 ENCOUNTER — Encounter: Payer: Self-pay | Admitting: Internal Medicine

## 2016-11-26 DIAGNOSIS — Z1231 Encounter for screening mammogram for malignant neoplasm of breast: Secondary | ICD-10-CM | POA: Diagnosis not present

## 2016-12-30 ENCOUNTER — Telehealth: Payer: Self-pay | Admitting: Internal Medicine

## 2016-12-30 ENCOUNTER — Telehealth: Payer: Self-pay

## 2016-12-30 MED ORDER — DICYCLOMINE HCL 10 MG PO CAPS: ORAL_CAPSULE | ORAL | 3 refills | Status: DC

## 2016-12-30 MED ORDER — INDAPAMIDE 1.25 MG PO TABS: 1.2500 mg | ORAL_TABLET | Freq: Every day | ORAL | 3 refills | Status: DC

## 2016-12-30 NOTE — Telephone Encounter (Signed)
Received fax from [facility] in regards to a refill on Lozol 1.32m tablet for patient. Medication was refilled per Dr. BVerlene Mayerrequest. Sent year supply

## 2016-12-30 NOTE — Telephone Encounter (Signed)
No sorry, only the Indapamide was handled this morning. Please advise on Dicyclomine refill

## 2016-12-30 NOTE — Telephone Encounter (Signed)
Done

## 2016-12-30 NOTE — Telephone Encounter (Signed)
Done this morning, disregard telephone call.

## 2016-12-30 NOTE — Telephone Encounter (Signed)
Refill Dicyclomine for one year

## 2016-12-30 NOTE — Telephone Encounter (Signed)
Patient calling to request refills be sent to her mail order pharmacy for Dicyclomine and Indapamide please.    Pharmacy:  [facility].   Best number for patient is:  [phone].  Thank you.

## 2016-12-30 NOTE — Telephone Encounter (Signed)
Dicyclomine also?

## 2017-01-13 ENCOUNTER — Other Ambulatory Visit: Payer: Self-pay | Admitting: Internal Medicine

## 2017-01-26 ENCOUNTER — Other Ambulatory Visit: Payer: Self-pay | Admitting: Internal Medicine

## 2017-04-14 ENCOUNTER — Telehealth: Payer: Self-pay

## 2017-04-14 MED ORDER — ESTROGENS CONJUGATED 0.625 MG PO TABS: 0.6250 mg | ORAL_TABLET | Freq: Every day | ORAL | 99 refills | Status: DC

## 2017-04-14 NOTE — Telephone Encounter (Signed)
Received fax from [facility] in regards to a refill on Premarin for patient. Medication was refilled per Dr. Verlene Mayer request. Sent 1 yr prn

## 2017-06-19 DIAGNOSIS — H2513 Age-related nuclear cataract, bilateral: Secondary | ICD-10-CM | POA: Diagnosis not present

## 2017-06-19 DIAGNOSIS — H5213 Myopia, bilateral: Secondary | ICD-10-CM | POA: Diagnosis not present

## 2017-06-19 DIAGNOSIS — H524 Presbyopia: Secondary | ICD-10-CM | POA: Diagnosis not present

## 2017-07-01 ENCOUNTER — Other Ambulatory Visit: Payer: Self-pay | Admitting: Internal Medicine

## 2017-07-01 DIAGNOSIS — E039 Hypothyroidism, unspecified: Secondary | ICD-10-CM

## 2017-08-11 ENCOUNTER — Other Ambulatory Visit: Payer: Self-pay | Admitting: Internal Medicine

## 2017-08-19 ENCOUNTER — Other Ambulatory Visit: Payer: Self-pay | Admitting: Internal Medicine

## 2017-08-19 DIAGNOSIS — E039 Hypothyroidism, unspecified: Secondary | ICD-10-CM

## 2017-08-28 DIAGNOSIS — I83893 Varicose veins of bilateral lower extremities with other complications: Secondary | ICD-10-CM | POA: Diagnosis not present

## 2017-08-28 DIAGNOSIS — I83813 Varicose veins of bilateral lower extremities with pain: Secondary | ICD-10-CM | POA: Diagnosis not present

## 2017-09-08 DIAGNOSIS — I83813 Varicose veins of bilateral lower extremities with pain: Secondary | ICD-10-CM | POA: Diagnosis not present

## 2017-09-08 DIAGNOSIS — I83893 Varicose veins of bilateral lower extremities with other complications: Secondary | ICD-10-CM | POA: Diagnosis not present

## 2017-09-24 ENCOUNTER — Other Ambulatory Visit: Payer: Self-pay | Admitting: Internal Medicine

## 2017-09-24 DIAGNOSIS — E785 Hyperlipidemia, unspecified: Secondary | ICD-10-CM

## 2017-09-24 DIAGNOSIS — I10 Essential (primary) hypertension: Secondary | ICD-10-CM

## 2017-09-24 DIAGNOSIS — Z0000 Encounter for general adult medical examination without abnormal findings: Secondary | ICD-10-CM

## 2017-09-24 DIAGNOSIS — R7989 Other specified abnormal findings of blood chemistry: Secondary | ICD-10-CM

## 2017-09-26 DIAGNOSIS — I83813 Varicose veins of bilateral lower extremities with pain: Secondary | ICD-10-CM | POA: Diagnosis not present

## 2017-09-29 ENCOUNTER — Other Ambulatory Visit: Payer: Self-pay | Admitting: Internal Medicine

## 2017-10-02 ENCOUNTER — Other Ambulatory Visit: Payer: Medicare Other | Admitting: Internal Medicine

## 2017-10-02 DIAGNOSIS — Z0000 Encounter for general adult medical examination without abnormal findings: Secondary | ICD-10-CM

## 2017-10-02 DIAGNOSIS — R7989 Other specified abnormal findings of blood chemistry: Secondary | ICD-10-CM

## 2017-10-02 DIAGNOSIS — E785 Hyperlipidemia, unspecified: Secondary | ICD-10-CM

## 2017-10-02 DIAGNOSIS — I10 Essential (primary) hypertension: Secondary | ICD-10-CM | POA: Diagnosis not present

## 2017-10-02 LAB — CBC WITH DIFFERENTIAL/PLATELET
BASOS PCT: 0.8 %
Basophils Absolute: 72 cells/uL (ref 0–200)
EOS PCT: 1.8 %
Eosinophils Absolute: 162 cells/uL (ref 15–500)
HEMATOCRIT: 33.6 % — AB (ref 35.0–45.0)
HEMOGLOBIN: 11.4 g/dL — AB (ref 11.7–15.5)
LYMPHS ABS: 2844 {cells}/uL (ref 850–3900)
MCH: 31.1 pg (ref 27.0–33.0)
MCHC: 33.9 g/dL (ref 32.0–36.0)
MCV: 91.6 fL (ref 80.0–100.0)
MONOS PCT: 7.2 %
MPV: 10.9 fL (ref 7.5–12.5)
NEUTROS ABS: 5274 {cells}/uL (ref 1500–7800)
Neutrophils Relative %: 58.6 %
Platelets: 323 10*3/uL (ref 140–400)
RBC: 3.67 10*6/uL — AB (ref 3.80–5.10)
RDW: 12 % (ref 11.0–15.0)
Total Lymphocyte: 31.6 %
WBC mixed population: 648 cells/uL (ref 200–950)
WBC: 9 10*3/uL (ref 3.8–10.8)

## 2017-10-02 LAB — LIPID PANEL
CHOL/HDL RATIO: 2.7 (calc) (ref <5.0)
Cholesterol: 186 mg/dL (ref <200)
HDL: 68 mg/dL (ref >50)
LDL Cholesterol (Calc): 95 mg/dL (calc)
NON-HDL CHOLESTEROL (CALC): 118 mg/dL (ref <130)
TRIGLYCERIDES: 135 mg/dL (ref <150)

## 2017-10-02 LAB — COMPLETE METABOLIC PANEL WITH GFR
AG Ratio: 1.3 (calc) (ref 1.0–2.5)
ALKALINE PHOSPHATASE (APISO): 110 U/L (ref 33–130)
ALT: 16 U/L (ref 6–29)
AST: 23 U/L (ref 10–35)
Albumin: 4.2 g/dL (ref 3.6–5.1)
BILIRUBIN TOTAL: 0.4 mg/dL (ref 0.2–1.2)
BUN: 19 mg/dL (ref 7–25)
CHLORIDE: 100 mmol/L (ref 98–110)
CO2: 33 mmol/L — ABNORMAL HIGH (ref 20–32)
Calcium: 10.2 mg/dL (ref 8.6–10.4)
Creat: 0.8 mg/dL (ref 0.60–0.93)
GFR, EST AFRICAN AMERICAN: 84 mL/min/{1.73_m2} (ref >=60)
GFR, Est Non African American: 73 mL/min/{1.73_m2} (ref >=60)
GLUCOSE: 87 mg/dL (ref 65–99)
Globulin: 3.2 g/dL (calc) (ref 1.9–3.7)
Potassium: 3.9 mmol/L (ref 3.5–5.3)
Sodium: 140 mmol/L (ref 135–146)
TOTAL PROTEIN: 7.4 g/dL (ref 6.1–8.1)

## 2017-10-02 LAB — TSH: TSH: 2.49 mIU/L (ref 0.40–4.50)

## 2017-10-06 ENCOUNTER — Ambulatory Visit (INDEPENDENT_AMBULATORY_CARE_PROVIDER_SITE_OTHER): Payer: Medicare Other | Admitting: Internal Medicine

## 2017-10-06 ENCOUNTER — Encounter: Payer: Self-pay | Admitting: Internal Medicine

## 2017-10-06 VITALS — BP 140/70 | HR 90 | Ht 64.0 in | Wt 150.0 lb

## 2017-10-06 DIAGNOSIS — Z0000 Encounter for general adult medical examination without abnormal findings: Secondary | ICD-10-CM

## 2017-10-06 DIAGNOSIS — Z79890 Hormone replacement therapy (postmenopausal): Secondary | ICD-10-CM | POA: Diagnosis not present

## 2017-10-06 DIAGNOSIS — I10 Essential (primary) hypertension: Secondary | ICD-10-CM

## 2017-10-06 DIAGNOSIS — E785 Hyperlipidemia, unspecified: Secondary | ICD-10-CM | POA: Diagnosis not present

## 2017-10-06 DIAGNOSIS — M720 Palmar fascial fibromatosis [Dupuytren]: Secondary | ICD-10-CM

## 2017-10-06 DIAGNOSIS — E039 Hypothyroidism, unspecified: Secondary | ICD-10-CM

## 2017-10-06 DIAGNOSIS — Z5181 Encounter for therapeutic drug level monitoring: Secondary | ICD-10-CM | POA: Diagnosis not present

## 2017-10-06 DIAGNOSIS — K5120 Ulcerative (chronic) proctitis without complications: Secondary | ICD-10-CM | POA: Diagnosis not present

## 2017-10-06 LAB — POCT URINALYSIS DIPSTICK
Appearance: NORMAL
Bilirubin, UA: NEGATIVE
Blood, UA: NEGATIVE
GLUCOSE UA: NEGATIVE
KETONES UA: NEGATIVE
LEUKOCYTES UA: NEGATIVE
Nitrite, UA: NEGATIVE
Odor: NORMAL
Protein, UA: NEGATIVE
SPEC GRAV UA: 1.015 (ref 1.010–1.025)
Urobilinogen, UA: 0.2 E.U./dL
pH, UA: 6.5 (ref 5.0–8.0)

## 2017-10-06 NOTE — Progress Notes (Signed)
Subjective:    [name], female    DOB: 25-Jan-1941, 77 y.o.   MRN: [id number]  HPI 77 year old White Female for [employer], health maintenance exam, and evaluation of medical issues.  She has a history of hypertension, hyperlipidemia, hypothyroidism, estrogen replacement, and GE reflux.  Local reaction to Prevnar 2017.  Arm was hot and swollen.  She had pneumococcal 23 in 2013.  These will not be repeated.  Had zoster vaccine 2011.  New prescription for Shingrix vaccine given.  Had tetanus immunization update 2013.  She saw Dr. Fuller Plan for colonoscopy because of urgency to defecate who diagnosed her with mild proctitis.  She has been treated with [place].  Pathology showed mild chronic active colitis.  This was in 2015.  She cannot tolerate Maxide as she has a history of photosensitivity reaction to this.  Amlodipine has caused leg swelling.  Does not like Hycodan for cough but can take Tussionex.  Had negative Cardiolite study in 2007.  Social history: She is married.  Has 3 adult children.  Husband worked in Tax [employer].  They are both retired.  She formally worked as a Herbalist.  She and her husband moved here in 60 from [place].  She drinks wine socially.  She quit smoking in 1998.  Family history: Father died of a sudden cardiac arrest with history of defibrillator, congestive heart failure, MI, CABG.  Mother living in her 68s.  One brother died of an MI.  Another brother died of unknown causes but has history of infection and alcohol abuse.  3 brothers living one with history of Crohn's disease.  Patient had a ophthalmic migraine in 2010.  Had herpes zoster 2001.  Received Zostavax vaccine in 2011.  Had hysterectomy with uni-oophorectomy 1977.  History of bilateral breast implants 1981.  Surgery for herniated lumbar disc L5-S1 in 1998.            Review of Systems Dupytren nodule right hand mid palm noticed  early April. No contracture     Objective:   Physical Exam  Constitutional: She is oriented to person, place, and time. She appears well-developed and well-nourished.  HENT:  Head: Normocephalic and atraumatic.  Right Ear: External ear normal.  Left Ear: External ear normal.  Mouth/Throat: Oropharynx is clear and moist. No oropharyngeal exudate.  Eyes: Pupils are equal, round, and reactive to light. EOM are normal. Right eye exhibits no discharge. Left eye exhibits no discharge. No scleral icterus.  Neck: Neck supple. No JVD present. No tracheal deviation present. No thyromegaly present.  Cardiovascular: Normal rate, regular rhythm, normal heart sounds and intact distal pulses.  No murmur heard. Pulmonary/Chest: Effort normal and breath sounds normal. No stridor. No respiratory distress. She has no wheezes. She has no rales. She exhibits no tenderness.  Abdominal: Soft. Bowel sounds are normal. She exhibits no distension and no mass. There is no tenderness. There is no rebound and no guarding. No hernia.  Musculoskeletal: She exhibits no edema.  Dupuytren's nodule right palm.  No contracture  Lymphadenopathy:    She has no cervical adenopathy.  Neurological: She is oriented to person, place, and time. She displays normal reflexes. No cranial nerve deficit. She exhibits normal muscle tone. Coordination normal.  Skin: Skin is warm and dry.  Psychiatric: She has a normal mood and affect. Her behavior is normal. Judgment and thought content normal.          Assessment & Plan:  Dupuytren's nodule right palm-refer to orthopedist for discussion/evaluation.  Hypertension stable on current regimen-  Hyperlipidemia-stable on statin  Hypothyroidism-stable on thyroid replacement  GE reflux-currently not on prescription PPI  Estrogen replacement-patient wants to continue and has been on this for many years  Bilateral breast implants  History of herniated lumbar disc status post  surgery 1998  Plan: Lipid panel is normal on statin medication.  TSH is normal on thyroid replacement.  Blood pressure stable on current regimen.  Orthopedic consultation regarding Dupuytren's nodule.  Return in 1 year or as needed and continue same medications.  Subjective:   Patient presents for Medicare Annual/Subsequent preventive examination.  Review Past Medical/Family/Social: See above   Risk Factors  Current exercise habits: Some light exercise Dietary issues discussed: Low-fat low carbohydrate  Cardiac risk factors: Hypertension and hyperlipidemia, family history  Depression Screen  (Note: if answer to either of the following is "Yes", a more complete depression screening is indicated)   Over the past two weeks, have you felt down, depressed or hopeless? No  Over the past two weeks, have you felt little interest or pleasure in doing things? No Have you lost interest or pleasure in daily life? No Do you often feel hopeless? No Do you cry easily over simple problems? No   Activities of Daily Living  In your present state of health, do you have any difficulty performing the following activities?:   Driving? No  Managing money? No  Feeding yourself? No  Getting from bed to chair? No  Climbing a flight of stairs? No  Preparing food and eating?: No  Bathing or showering? No  Getting dressed: No  Getting to the toilet? No  Using the toilet:No  Moving around from place to place: No  In the past year have you fallen or had a near fall?:No  Are you sexually active? yes Do you have more than one partner? No   Hearing Difficulties: No  Do you often ask people to speak up or repeat themselves? No  Do you experience ringing or noises in your ears? No  Do you have difficulty understanding soft or whispered voices? No  Do you feel that you have a problem with memory? No Do you often misplace items? No    Home Safety:  Do you have a smoke alarm at your residence? Yes Do  you have grab bars in the bathroom? Do you have throw rugs in your house?   Cognitive Testing  Alert? Yes Normal Appearance?Yes  Oriented to person? Yes Place? Yes  Time? Yes  Recall of three objects? Yes  Can perform simple calculations? Yes  Displays appropriate judgment?Yes  Can read the correct time from a watch face?Yes   List the Names of Other Physician/Practitioners you currently use:  See referral list for the physicians patient is currently seeing.     Review of Systems: See above   Objective:     General appearance: Appears stated age and mildly obese  Head: Normocephalic, without obvious abnormality, atraumatic  Eyes: conj clear, EOMi PEERLA  Ears: normal TM's and external ear canals both ears  Nose: Nares normal. Septum midline. Mucosa normal. No drainage or sinus tenderness.  Throat: lips, mucosa, and tongue normal; teeth and gums normal  Neck: no adenopathy, no carotid bruit, no JVD, supple, symmetrical, trachea midline and thyroid not enlarged, symmetric, no tenderness/mass/nodules  No CVA tenderness.  Lungs: clear to auscultation bilaterally  Breasts: Bilateral breast implants Heart: regular rate and rhythm, S1, S2 normal,  no murmur, click, rub or gallop  Abdomen: soft, non-tender; bowel sounds normal; no masses, no organomegaly  Musculoskeletal: ROM normal in all joints, no crepitus, no deformity, Normal muscle strengthen. Back  is symmetric, no curvature. Skin: Skin color, texture, turgor normal. No rashes or lesions  Lymph nodes: Cervical, supraclavicular, and axillary nodes normal.  Neurologic: CN 2 -12 Normal, Normal symmetric reflexes. Normal coordination and gait  Psych: Alert & Oriented x 3, Mood appear stable.    Assessment:    Annual wellness medicare exam   Plan:    During the course of the visit the patient was educated and counseled about appropriate screening and preventive services including:   Annual mammogram     Patient  Instructions (the written plan) was given to the patient.  Medicare Attestation  I have personally reviewed:  The patient's medical and social history  Their use of alcohol, tobacco or illicit drugs  Their current medications and supplements  The patient's functional ability including ADLs,fall risks, home safety risks, cognitive, and hearing and visual impairment  Diet and physical activities  Evidence for depression or mood disorders  The patient's weight, height, BMI, and visual acuity have been recorded in the chart. I have made referrals, counseling, and provided education to the patient based on review of the above and I have provided the patient with a written personalized care plan for preventive services.

## 2017-10-12 DIAGNOSIS — Z79890 Hormone replacement therapy (postmenopausal): Secondary | ICD-10-CM

## 2017-10-12 DIAGNOSIS — E039 Hypothyroidism, unspecified: Secondary | ICD-10-CM | POA: Insufficient documentation

## 2017-10-12 DIAGNOSIS — K5120 Ulcerative (chronic) proctitis without complications: Secondary | ICD-10-CM | POA: Insufficient documentation

## 2017-10-12 DIAGNOSIS — Z5181 Encounter for therapeutic drug level monitoring: Secondary | ICD-10-CM | POA: Insufficient documentation

## 2017-10-12 NOTE — Patient Instructions (Signed)
It was a pleasure to see you today.  We will arrange orthopedic consultation regarding Dupuytren's nodule right palm and further advisement.  Continue same medications and return in 1 year or as needed.  Have annual mammogram and flu vaccine.

## 2017-10-13 ENCOUNTER — Telehealth: Payer: Self-pay | Admitting: Internal Medicine

## 2017-10-13 NOTE — Telephone Encounter (Signed)
Faxed Office notes 10-06-17 and referral to [facility]

## 2017-10-13 NOTE — Telephone Encounter (Signed)
Received fax stating Kristen Hopkins has an appointment scheduled for 11-14-17 @1 :15 with Dr Apolonio Schneiders

## 2017-10-20 ENCOUNTER — Other Ambulatory Visit: Payer: Self-pay | Admitting: Internal Medicine

## 2017-10-23 DIAGNOSIS — I83893 Varicose veins of bilateral lower extremities with other complications: Secondary | ICD-10-CM | POA: Diagnosis not present

## 2017-10-23 DIAGNOSIS — I83813 Varicose veins of bilateral lower extremities with pain: Secondary | ICD-10-CM | POA: Diagnosis not present

## 2017-11-14 DIAGNOSIS — M720 Palmar fascial fibromatosis [Dupuytren]: Secondary | ICD-10-CM | POA: Insufficient documentation

## 2017-12-10 DIAGNOSIS — I8312 Varicose veins of left lower extremity with inflammation: Secondary | ICD-10-CM | POA: Diagnosis not present

## 2017-12-10 DIAGNOSIS — I83812 Varicose veins of left lower extremities with pain: Secondary | ICD-10-CM | POA: Diagnosis not present

## 2017-12-12 DIAGNOSIS — I8312 Varicose veins of left lower extremity with inflammation: Secondary | ICD-10-CM | POA: Diagnosis not present

## 2017-12-12 DIAGNOSIS — I83812 Varicose veins of left lower extremities with pain: Secondary | ICD-10-CM | POA: Diagnosis not present

## 2018-01-05 ENCOUNTER — Other Ambulatory Visit: Payer: Self-pay | Admitting: Internal Medicine

## 2018-01-07 DIAGNOSIS — I83892 Varicose veins of left lower extremities with other complications: Secondary | ICD-10-CM | POA: Diagnosis not present

## 2018-01-07 DIAGNOSIS — I8312 Varicose veins of left lower extremity with inflammation: Secondary | ICD-10-CM | POA: Diagnosis not present

## 2018-01-09 DIAGNOSIS — I8312 Varicose veins of left lower extremity with inflammation: Secondary | ICD-10-CM | POA: Diagnosis not present

## 2018-02-05 DIAGNOSIS — I8312 Varicose veins of left lower extremity with inflammation: Secondary | ICD-10-CM | POA: Diagnosis not present

## 2018-02-05 DIAGNOSIS — I83892 Varicose veins of left lower extremities with other complications: Secondary | ICD-10-CM | POA: Diagnosis not present

## 2018-02-16 DIAGNOSIS — I8312 Varicose veins of left lower extremity with inflammation: Secondary | ICD-10-CM | POA: Diagnosis not present

## 2018-02-16 DIAGNOSIS — I8002 Phlebitis and thrombophlebitis of superficial vessels of left lower extremity: Secondary | ICD-10-CM | POA: Diagnosis not present

## 2018-02-23 ENCOUNTER — Other Ambulatory Visit: Payer: Self-pay | Admitting: Internal Medicine

## 2018-03-02 DIAGNOSIS — I8312 Varicose veins of left lower extremity with inflammation: Secondary | ICD-10-CM | POA: Diagnosis not present

## 2018-03-02 DIAGNOSIS — I83892 Varicose veins of left lower extremities with other complications: Secondary | ICD-10-CM | POA: Diagnosis not present

## 2018-03-31 DIAGNOSIS — I8312 Varicose veins of left lower extremity with inflammation: Secondary | ICD-10-CM | POA: Diagnosis not present

## 2018-05-10 ENCOUNTER — Other Ambulatory Visit: Payer: Self-pay | Admitting: Internal Medicine

## 2018-06-04 DIAGNOSIS — I8312 Varicose veins of left lower extremity with inflammation: Secondary | ICD-10-CM | POA: Diagnosis not present

## 2018-06-04 DIAGNOSIS — I8002 Phlebitis and thrombophlebitis of superficial vessels of left lower extremity: Secondary | ICD-10-CM | POA: Diagnosis not present

## 2018-06-21 DIAGNOSIS — H527 Unspecified disorder of refraction: Secondary | ICD-10-CM | POA: Diagnosis not present

## 2018-06-23 DIAGNOSIS — H2513 Age-related nuclear cataract, bilateral: Secondary | ICD-10-CM | POA: Diagnosis not present

## 2018-06-23 DIAGNOSIS — H5213 Myopia, bilateral: Secondary | ICD-10-CM | POA: Diagnosis not present

## 2018-08-23 ENCOUNTER — Other Ambulatory Visit: Payer: Self-pay | Admitting: Internal Medicine

## 2018-08-23 DIAGNOSIS — E039 Hypothyroidism, unspecified: Secondary | ICD-10-CM

## 2018-09-19 ENCOUNTER — Telehealth: Payer: Self-pay | Admitting: Internal Medicine

## 2018-09-19 ENCOUNTER — Encounter: Payer: Self-pay | Admitting: Internal Medicine

## 2018-09-19 DIAGNOSIS — R197 Diarrhea, unspecified: Secondary | ICD-10-CM | POA: Diagnosis not present

## 2018-09-19 DIAGNOSIS — R509 Fever, unspecified: Secondary | ICD-10-CM | POA: Diagnosis not present

## 2018-09-19 DIAGNOSIS — K529 Noninfective gastroenteritis and colitis, unspecified: Secondary | ICD-10-CM | POA: Diagnosis not present

## 2018-09-19 MED ORDER — ONDANSETRON HCL 4 MG PO TABS: 4.0000 mg | ORAL_TABLET | Freq: Three times a day (TID) | ORAL | 0 refills | Status: DC | PRN

## 2018-09-19 NOTE — Telephone Encounter (Signed)
Carol's husband wanted prescription called into [facility] in [place] today.  It was supposed to be [place] which was not on her list of pharmacies in [place].  I sent it to [facility].   Zofran prescription will be resent to this pharmacy.  He also tells me that she is now vomited a couple of times and had diarrhea.  I suspect she has a gastroenteritis that is viral.  We will continue to monitor her symptoms and she is to remain at home.

## 2018-09-19 NOTE — Telephone Encounter (Addendum)
Received phone call from patient's husband and then spoke with patient.  He went to the store and came home and found her with shaking chills and some nausea.  No vomiting.  No documented fever.  This had sudden onset.  Her general health is good.  She has no urinary tract infection symptoms.  No diarrhea.  No myalgias.  They are asking what to do.  I can call her in some Zofran for nausea but we need to continue to monitor her symptoms.  This is all rather sudden and I do not have a lot of information to go on.  She could be coming down with some sort of viral entity.  Advised staying well-hydrated with Coke or ginger ale or 7-Up.  Avoid milk, water, and orange juice.  Take Zofran for nausea.  Eat crackers and soup if nauseated and no solid food.  They may call back if they have further questions.:  Zofran 4 mg #20.  1 p.o. every 8 hours as needed nausea.

## 2018-09-19 NOTE — Telephone Encounter (Signed)
Patient has continued to feel nauseated and has diarrhea.  She has Zofran for nausea.  Her husband called because she now has fever of 101 degrees and he is alarmed.  Explained to him that fever does go up around this time of day and likely is to remain elevated until after midnight because it runs a circadian rhythm.  He may give her Tylenol.  She has no travel exposure and has not been out very much.  They ate steak last night.  She had marmalade this morning along with a cup of tea.  Has not eaten any well fruits or vegetables.  Has been eating at home.  Continue to monitor symptoms.  He may call back if he has questions or concerns.  Would not advise emergency department visit at this point.  Try to keep her hydrated with plenty of liquids.  Avoid milk and milk products.

## 2018-09-26 ENCOUNTER — Other Ambulatory Visit: Payer: Self-pay | Admitting: Internal Medicine

## 2018-10-25 ENCOUNTER — Other Ambulatory Visit: Payer: Self-pay | Admitting: Internal Medicine

## 2018-11-03 ENCOUNTER — Other Ambulatory Visit: Payer: Self-pay

## 2018-11-03 ENCOUNTER — Other Ambulatory Visit: Payer: Medicare Other | Admitting: Internal Medicine

## 2018-11-03 DIAGNOSIS — Z5181 Encounter for therapeutic drug level monitoring: Secondary | ICD-10-CM

## 2018-11-03 DIAGNOSIS — I10 Essential (primary) hypertension: Secondary | ICD-10-CM

## 2018-11-03 DIAGNOSIS — E039 Hypothyroidism, unspecified: Secondary | ICD-10-CM | POA: Diagnosis not present

## 2018-11-03 DIAGNOSIS — E785 Hyperlipidemia, unspecified: Secondary | ICD-10-CM | POA: Diagnosis not present

## 2018-11-03 DIAGNOSIS — K5120 Ulcerative (chronic) proctitis without complications: Secondary | ICD-10-CM | POA: Diagnosis not present

## 2018-11-03 DIAGNOSIS — M720 Palmar fascial fibromatosis [Dupuytren]: Secondary | ICD-10-CM

## 2018-11-03 DIAGNOSIS — Z0000 Encounter for general adult medical examination without abnormal findings: Secondary | ICD-10-CM | POA: Diagnosis not present

## 2018-11-03 LAB — CBC WITH DIFFERENTIAL/PLATELET
Absolute Monocytes: 714 cells/uL (ref 200–950)
Basophils Absolute: 53 cells/uL (ref 0–200)
Basophils Relative: 0.5 %
Eosinophils Absolute: 231 cells/uL (ref 15–500)
Eosinophils Relative: 2.2 %
HCT: 32.9 % — ABNORMAL LOW (ref 35.0–45.0)
Hemoglobin: 11 g/dL — ABNORMAL LOW (ref 11.7–15.5)
Lymphs Abs: 2783 cells/uL (ref 850–3900)
MCH: 30.6 pg (ref 27.0–33.0)
MCHC: 33.4 g/dL (ref 32.0–36.0)
MCV: 91.6 fL (ref 80.0–100.0)
MPV: 11 fL (ref 7.5–12.5)
Monocytes Relative: 6.8 %
Neutro Abs: 6720 cells/uL (ref 1500–7800)
Neutrophils Relative %: 64 %
Platelets: 332 10*3/uL (ref 140–400)
RBC: 3.59 10*6/uL — ABNORMAL LOW (ref 3.80–5.10)
RDW: 12 % (ref 11.0–15.0)
Total Lymphocyte: 26.5 %
WBC: 10.5 10*3/uL (ref 3.8–10.8)

## 2018-11-03 LAB — COMPLETE METABOLIC PANEL WITH GFR
AG Ratio: 1.3 (calc) (ref 1.0–2.5)
ALT: 17 U/L (ref 6–29)
AST: 22 U/L (ref 10–35)
Albumin: 4.2 g/dL (ref 3.6–5.1)
Alkaline phosphatase (APISO): 120 U/L (ref 37–153)
BUN: 20 mg/dL (ref 7–25)
CO2: 27 mmol/L (ref 20–32)
Calcium: 10.1 mg/dL (ref 8.6–10.4)
Chloride: 99 mmol/L (ref 98–110)
Creat: 0.91 mg/dL (ref 0.60–0.93)
GFR, Est African American: 72 mL/min/{1.73_m2} (ref >=60)
GFR, Est Non African American: 62 mL/min/{1.73_m2} (ref >=60)
Globulin: 3.2 g/dL (calc) (ref 1.9–3.7)
Glucose, Bld: 98 mg/dL (ref 65–99)
Potassium: 3.8 mmol/L (ref 3.5–5.3)
Sodium: 138 mmol/L (ref 135–146)
Total Bilirubin: 0.5 mg/dL (ref 0.2–1.2)
Total Protein: 7.4 g/dL (ref 6.1–8.1)

## 2018-11-03 LAB — LIPID PANEL
Cholesterol: 170 mg/dL (ref <200)
HDL: 61 mg/dL (ref >=50)
LDL Cholesterol (Calc): 82 mg/dL (calc)
Non-HDL Cholesterol (Calc): 109 mg/dL (calc) (ref <130)
Total CHOL/HDL Ratio: 2.8 (calc) (ref <5.0)
Triglycerides: 177 mg/dL — ABNORMAL HIGH (ref <150)

## 2018-11-03 LAB — TSH: TSH: 3.84 mIU/L (ref 0.40–4.50)

## 2018-11-10 ENCOUNTER — Ambulatory Visit (INDEPENDENT_AMBULATORY_CARE_PROVIDER_SITE_OTHER): Payer: Medicare Other | Admitting: Internal Medicine

## 2018-11-10 ENCOUNTER — Encounter: Payer: Self-pay | Admitting: Internal Medicine

## 2018-11-10 ENCOUNTER — Other Ambulatory Visit: Payer: Self-pay

## 2018-11-10 VITALS — BP 148/70 | HR 77 | Temp 97.3°F | Ht 64.0 in | Wt 152.0 lb

## 2018-11-10 DIAGNOSIS — K5120 Ulcerative (chronic) proctitis without complications: Secondary | ICD-10-CM | POA: Diagnosis not present

## 2018-11-10 DIAGNOSIS — Z5181 Encounter for therapeutic drug level monitoring: Secondary | ICD-10-CM | POA: Diagnosis not present

## 2018-11-10 DIAGNOSIS — D649 Anemia, unspecified: Secondary | ICD-10-CM

## 2018-11-10 DIAGNOSIS — I10 Essential (primary) hypertension: Secondary | ICD-10-CM

## 2018-11-10 DIAGNOSIS — R350 Frequency of micturition: Secondary | ICD-10-CM | POA: Diagnosis not present

## 2018-11-10 DIAGNOSIS — E039 Hypothyroidism, unspecified: Secondary | ICD-10-CM | POA: Diagnosis not present

## 2018-11-10 DIAGNOSIS — Z0000 Encounter for general adult medical examination without abnormal findings: Secondary | ICD-10-CM | POA: Diagnosis not present

## 2018-11-10 DIAGNOSIS — Z79890 Hormone replacement therapy (postmenopausal): Secondary | ICD-10-CM | POA: Diagnosis not present

## 2018-11-10 DIAGNOSIS — E781 Pure hyperglyceridemia: Secondary | ICD-10-CM

## 2018-11-10 LAB — POCT URINALYSIS DIP (CLINITEK)
Bilirubin, UA: NEGATIVE
Blood, UA: NEGATIVE
Glucose, UA: NEGATIVE mg/dL
Ketones, POC UA: NEGATIVE mg/dL
Leukocytes, UA: NEGATIVE
Nitrite, UA: NEGATIVE
POC PROTEIN,UA: NEGATIVE
Spec Grav, UA: 1.015 (ref 1.010–1.025)
Urobilinogen, UA: 0.2 E.U./dL
pH, UA: 5 (ref 5.0–8.0)

## 2018-11-18 NOTE — Progress Notes (Signed)
Subjective:    [name], female    DOB: Jun 12, 1940, 78 y.o.   MRN: [id number]  HPI 78 year old Female for health maintenance exam, Medicare wellness, and evaluation of medical issues. Complaining of urinary frequency. Urine specimen is normal without evidence of glucosuria or infection.  Hgb is slightly low at 11 grams. Advised multivitamin and follow up in a few weeks. No dark stools or BRBPR.  Had gastroenteritis in April it took several days to resolve.  Was treated with antinausea medication.  Seen diabetic.  To be a viral gastroenteritis.  It had sudden onset.  Patient has history of hypertension, hyperlipidemia, hypothyroidism, estrogen replacement and GE reflux.  Had local reaction to Prevnar in 2017.  Her arm was hot and swollen.  She had pneumococcal 23 in 2013.  These of course will not be repeated.  Had zoster vaccine 2011.  Has been given prescription for Shingrix vaccine.  Had tetanus immunization 2013.  She saw Kristen Hopkins for colonoscopy because of urge to defecate in 2015.  Has been treated with [place] for mild chronic active colitis.  Currently not taking that medication.  Just using Bentyl for episodic diarrhea after meals.  Had photosensitivity reaction to Maxide therefore takes Lozol.  Amlodipine caused leg swelling.  Does not like Hycodan for cough but can take Tussionex.  Had negative Cardiolite study in 2007.  Had ophthalmic migraine in 2010.  Had herpes zoster 2001.  Received Zostavax vaccine in 2011.  Had hysterectomy with union for ectomy 1977.  History of bilateral breast implants 1991.  Surgery for herniated lumbar disc L5-S1 in 1998.  Social history: She is married.  Has 3 adult children.  Kristen Hopkins.  They are both retired.  She formerly worked as a Herbalist.  She and her Kristen moved here in 58 from [place].  She drinks wine socially.  She quit smoking in 1998.  Family history: Father  died of a sudden cardiac arrest with history of defibrillator congestive heart failure MI and CABG.  Mother living in her 20s.  One brother died of an MI.  3 brothers living one with history of Crohn's disease.  Another brother died of unknown causes but had history of infection and alcohol abuse.      Review of Systems  Constitutional: Negative.   Genitourinary: Positive for frequency.       Objective:   Physical Exam Vitals signs reviewed.  Constitutional:      General: She is not in acute distress.    Appearance: Normal appearance. She is not diaphoretic.  HENT:     Head: Normocephalic and atraumatic.     Right Ear: Tympanic membrane and ear canal normal.     Left Ear: Tympanic membrane and ear canal normal.     Nose: Nose normal.     Mouth/Throat:     Mouth: Mucous membranes are moist.     Pharynx: Oropharynx is clear.  Eyes:     General: No scleral icterus.       Right eye: No discharge.        Left eye: No discharge.     Extraocular Movements: Extraocular movements intact.     Conjunctiva/sclera: Conjunctivae normal.     Pupils: Pupils are equal, round, and reactive to light.  Neck:     Musculoskeletal: Neck supple. No neck rigidity.     Vascular: No carotid bruit.     Comments: No thyromegaly,  no adenopathy Cardiovascular:     Rate and Rhythm: Normal rate and regular rhythm.     Pulses: Normal pulses.     Heart sounds: Normal heart sounds. No murmur.     Comments: No bruits Pulmonary:     Effort: Pulmonary effort is normal. No respiratory distress.     Breath sounds: No wheezing.     Comments: Bilateral breast implants Abdominal:     General: There is no distension.     Palpations: Abdomen is soft. There is no mass.     Tenderness: There is no right CVA tenderness, left CVA tenderness or guarding.  Genitourinary:    Comments: Bimanual exam normal.  Pap deferred due to age Musculoskeletal:     Right lower leg: No edema.     Left lower leg: No edema.   Lymphadenopathy:     Cervical: No cervical adenopathy.  Skin:    General: Skin is warm and dry.  Neurological:     General: No focal deficit present.     Mental Status: She is alert. She is disoriented.     Cranial Nerves: No cranial nerve deficit.     Motor: No weakness.  Psychiatric:        Mood and Affect: Mood normal.        Behavior: Behavior normal.        Thought Content: Thought content normal.        Judgment: Judgment normal.           Assessment & Hopkins:  Hopkins:   Urinary frequency but no evidence of infection or glucose in her urine.  Patient will continue to monitor this symptom and call if persistent.  Essential hypertension-stable on current regimen of Lozol, Norvasc, losartan and metoprolol.  Blood pressure mildly elevated today at 148/70.  Continue to monitor at home.  BMI is stable 26.09.  Hyperlipidemia-stable on statin the exception of elevated triglycerides at 177 and previously were normal last year.  Continue to work on diet and exercise.  Will not change medication at this time.  She is on low-dose Lipitor 10 mg daily.  Hypothyroidism-stable on thyroid replacement.  Estrogen replacement-patient was to continue with this.  She has been on it for many years now but would prefer that she stop it.  Bilateral breast implants  History of herniated lumbar disc status post surgery 1998  Normocytic anemia-return in a few weeks for CBC and anemia studies.  Advised to make sure she is getting iron rich foods and take a multivitamin with iron daily.  Had colonoscopy in 2015.  History of proctitis.  No melena or bright red blood per rectum.  Additional 30 minutes spent reviewing these problems and making recommendations in addition to annual Medicare wellness visit and time spent with physical examination as well as reviewing old records.  Subjective:   Patient presents for Medicare Annual/Subsequent preventive examination.  Review Past Medical/Family/Social:  See above   Risk Factors  Current exercise habits: Does some walking and light exercise Dietary issues discussed: Low-fat low carbohydrate  Cardiac risk factors: Hyperlipidemia hypertension, family history  Depression Screen  (Note: if answer to either of the following is "Yes", a more complete depression screening is indicated)   Over the past two weeks, have you felt down, depressed or hopeless? No  Over the past two weeks, have you felt little interest or pleasure in doing things? No Have you lost interest or pleasure in daily life? No Do you often feel hopeless? No  Do you cry easily over simple problems? No   Activities of Daily Living  In your present state of health, do you have any difficulty performing the following activities?:   Driving? No  Managing money? No  Feeding yourself? No  Getting from bed to chair? No  Climbing a flight of stairs? No  Preparing food and eating?: No  Bathing or showering? No  Getting dressed: No  Getting to the toilet? No  Using the toilet:No  Moving around from place to place: No  In the past year have you fallen or had a near fall?:No  Are you sexually active? yes  Do you have more than one partner? No   Hearing Difficulties: No  Do you often ask people to speak up or repeat themselves? No  Do you experience ringing or noises in your ears? No  Do you have difficulty understanding soft or whispered voices? No  Do you feel that you have a problem with memory? No Do you often misplace items? No    Home Safety:  Do you have a smoke alarm at your residence? Yes Do you have grab bars in the bathroom?  None Do you have throw rugs in your house?  None   Cognitive Testing  Alert? Yes Normal Appearance?Yes  Oriented to person? Yes Place? Yes  Time? Yes  Recall of three objects? Yes  Can perform simple calculations? Yes  Displays appropriate judgment?Yes  Can read the correct time from a watch face?Yes   List the Names of Other  Physician/Practitioners you currently use:  See referral list for the physicians patient is currently seeing.     Review of Systems: See above   Objective:     General appearance: Appears stated age and thin Head: Normocephalic, without obvious abnormality, atraumatic  Eyes: conj clear, EOMi PEERLA  Ears: normal TM's and external ear canals both ears  Nose: Nares normal. Septum midline. Mucosa normal. No drainage or sinus tenderness.  Throat: lips, mucosa, and tongue normal; teeth and gums normal  Neck: no adenopathy, no carotid bruit, no JVD, supple, symmetrical, trachea midline and thyroid not enlarged, symmetric, no tenderness/mass/nodules  No CVA tenderness.  Lungs: clear to auscultation bilaterally  Breasts: Bilateral implants Heart: regular rate and rhythm, S1, S2 normal, no murmur, click, rub or gallop  Abdomen: soft, non-tender; bowel sounds normal; no masses, no organomegaly  Musculoskeletal: ROM normal in all joints, no crepitus, no deformity, Normal muscle strengthen. Back  is symmetric, no curvature. Skin: Skin color, texture, turgor normal. No rashes or lesions  Lymph nodes: Cervical, supraclavicular, and axillary nodes normal.  Neurologic: CN 2 -12 Normal, Normal symmetric reflexes. Normal coordination and gait  Psych: Alert & Oriented x 3, Mood appear stable.    Assessment:    Annual wellness medicare exam   Hopkins:    During the course of the visit the patient was educated and counseled about appropriate screening and preventive services including:   Annual mammogram     Patient Instructions (the written Hopkins) was given to the patient.  Medicare Attestation  I have personally reviewed:  The patient's medical and social history  Their use of alcohol, tobacco or illicit drugs  Their current medications and supplements  The patient's functional ability including ADLs,fall risks, home safety risks, cognitive, and hearing and visual impairment  Diet and  physical activities  Evidence for depression or mood disorders  The patient's weight, height, BMI, and visual acuity have been recorded in the chart. I have  made referrals, counseling, and provided education to the patient based on review of the above and I have provided the patient with a written personalized care Hopkins for preventive services.

## 2018-11-18 NOTE — Patient Instructions (Signed)
Take multivitamin with iron and return in a few weeks for follow-up on anemia.  Continue other medications.  Watch triglycerides/fat in diet.

## 2018-11-19 ENCOUNTER — Other Ambulatory Visit: Payer: Medicare Other | Admitting: Internal Medicine

## 2018-11-19 ENCOUNTER — Other Ambulatory Visit: Payer: Self-pay

## 2018-11-19 DIAGNOSIS — D649 Anemia, unspecified: Secondary | ICD-10-CM

## 2018-11-20 LAB — IRON, TOTAL/TOTAL IRON BINDING CAP
%SAT: 27 % (calc) (ref 16–45)
Iron: 89 ug/dL (ref 45–160)
TIBC: 325 mcg/dL (calc) (ref 250–450)

## 2018-12-28 ENCOUNTER — Other Ambulatory Visit: Payer: Self-pay | Admitting: Internal Medicine

## 2019-01-20 ENCOUNTER — Other Ambulatory Visit: Payer: Self-pay | Admitting: Internal Medicine

## 2019-01-25 ENCOUNTER — Other Ambulatory Visit: Payer: Self-pay | Admitting: Internal Medicine

## 2019-02-26 ENCOUNTER — Other Ambulatory Visit: Payer: Self-pay | Admitting: Internal Medicine

## 2019-02-26 DIAGNOSIS — E039 Hypothyroidism, unspecified: Secondary | ICD-10-CM

## 2019-03-10 ENCOUNTER — Other Ambulatory Visit: Payer: Self-pay | Admitting: Internal Medicine

## 2019-06-10 ENCOUNTER — Ambulatory Visit: Payer: Medicare Other | Attending: Internal Medicine

## 2019-06-10 DIAGNOSIS — Z23 Encounter for immunization: Secondary | ICD-10-CM | POA: Insufficient documentation

## 2019-06-25 DIAGNOSIS — H52203 Unspecified astigmatism, bilateral: Secondary | ICD-10-CM | POA: Diagnosis not present

## 2019-06-25 DIAGNOSIS — H2513 Age-related nuclear cataract, bilateral: Secondary | ICD-10-CM | POA: Diagnosis not present

## 2019-06-25 DIAGNOSIS — H5203 Hypermetropia, bilateral: Secondary | ICD-10-CM | POA: Diagnosis not present

## 2019-06-25 DIAGNOSIS — H31003 Unspecified chorioretinal scars, bilateral: Secondary | ICD-10-CM | POA: Diagnosis not present

## 2019-06-28 ENCOUNTER — Ambulatory Visit: Payer: Medicare Other | Attending: Internal Medicine

## 2019-06-28 ENCOUNTER — Ambulatory Visit: Payer: Medicare Other

## 2019-06-28 DIAGNOSIS — Z23 Encounter for immunization: Secondary | ICD-10-CM

## 2019-06-28 NOTE — Progress Notes (Signed)
   Covid-19 Vaccination Clinic  Name:  Kristen Hopkins    MRN: [id number] DOB: [birth year]  06/28/2019  Kristen Hopkins was observed post Covid-19 immunization for 15 minutes without incidence. She was provided with Vaccine Information Sheet and instruction to access the V-Safe system.   Kristen Hopkins was instructed to call 911 with any severe reactions post vaccine: Marland Kitchen Difficulty breathing  . Swelling of your face and throat  . A fast heartbeat  . A bad rash all over your body  . Dizziness and weakness    Immunizations Administered    Name Date Dose VIS Date Route   Pfizer COVID-19 Vaccine 06/28/2019 11:33 AM 0.3 mL 05/07/2019 Intramuscular   Manufacturer: [place]   Lot: [id number]   [place]: [address]-9

## 2019-07-25 ENCOUNTER — Other Ambulatory Visit: Payer: Self-pay | Admitting: Internal Medicine

## 2019-07-25 NOTE — Telephone Encounter (Signed)
Please call and make CPE appt for June before refilling

## 2019-07-27 ENCOUNTER — Other Ambulatory Visit: Payer: Self-pay

## 2019-07-27 MED ORDER — METOPROLOL SUCCINATE ER 25 MG PO TB24: 25.0000 mg | ORAL_TABLET | Freq: Two times a day (BID) | ORAL | 0 refills | Status: DC

## 2019-09-27 ENCOUNTER — Other Ambulatory Visit: Payer: Self-pay | Admitting: Internal Medicine

## 2019-11-08 ENCOUNTER — Other Ambulatory Visit: Payer: Self-pay | Admitting: Internal Medicine

## 2019-11-12 ENCOUNTER — Other Ambulatory Visit: Payer: Self-pay

## 2019-11-12 ENCOUNTER — Other Ambulatory Visit: Payer: Medicare Other | Admitting: Internal Medicine

## 2019-11-12 DIAGNOSIS — Z0000 Encounter for general adult medical examination without abnormal findings: Secondary | ICD-10-CM

## 2019-11-12 DIAGNOSIS — E039 Hypothyroidism, unspecified: Secondary | ICD-10-CM | POA: Diagnosis not present

## 2019-11-12 DIAGNOSIS — E781 Pure hyperglyceridemia: Secondary | ICD-10-CM

## 2019-11-12 DIAGNOSIS — Z79890 Hormone replacement therapy (postmenopausal): Secondary | ICD-10-CM

## 2019-11-12 DIAGNOSIS — D649 Anemia, unspecified: Secondary | ICD-10-CM

## 2019-11-12 DIAGNOSIS — I10 Essential (primary) hypertension: Secondary | ICD-10-CM

## 2019-11-12 DIAGNOSIS — K5120 Ulcerative (chronic) proctitis without complications: Secondary | ICD-10-CM | POA: Diagnosis not present

## 2019-11-13 LAB — LIPID PANEL
Cholesterol: 175 mg/dL (ref <200)
HDL: 64 mg/dL (ref >=50)
LDL Cholesterol (Calc): 90 mg/dL (calc)
Non-HDL Cholesterol (Calc): 111 mg/dL (calc) (ref <130)
Total CHOL/HDL Ratio: 2.7 (calc) (ref <5.0)
Triglycerides: 110 mg/dL (ref <150)

## 2019-11-13 LAB — COMPLETE METABOLIC PANEL WITH GFR
AG Ratio: 1.3 (calc) (ref 1.0–2.5)
ALT: 18 U/L (ref 6–29)
AST: 26 U/L (ref 10–35)
Albumin: 4.3 g/dL (ref 3.6–5.1)
Alkaline phosphatase (APISO): 110 U/L (ref 37–153)
BUN: 18 mg/dL (ref 7–25)
CO2: 27 mmol/L (ref 20–32)
Calcium: 9.9 mg/dL (ref 8.6–10.4)
Chloride: 101 mmol/L (ref 98–110)
Creat: 0.88 mg/dL (ref 0.60–0.93)
GFR, Est African American: 74 mL/min/{1.73_m2} (ref >=60)
GFR, Est Non African American: 64 mL/min/{1.73_m2} (ref >=60)
Globulin: 3.3 g/dL (calc) (ref 1.9–3.7)
Glucose, Bld: 94 mg/dL (ref 65–99)
Potassium: 3.7 mmol/L (ref 3.5–5.3)
Sodium: 140 mmol/L (ref 135–146)
Total Bilirubin: 0.4 mg/dL (ref 0.2–1.2)
Total Protein: 7.6 g/dL (ref 6.1–8.1)

## 2019-11-13 LAB — CBC WITH DIFFERENTIAL/PLATELET
Absolute Monocytes: 697 cells/uL (ref 200–950)
Basophils Absolute: 92 cells/uL (ref 0–200)
Basophils Relative: 1.1 %
Eosinophils Absolute: 235 cells/uL (ref 15–500)
Eosinophils Relative: 2.8 %
HCT: 35.9 % (ref 35.0–45.0)
Hemoglobin: 12 g/dL (ref 11.7–15.5)
Lymphs Abs: 3091 cells/uL (ref 850–3900)
MCH: 31.2 pg (ref 27.0–33.0)
MCHC: 33.4 g/dL (ref 32.0–36.0)
MCV: 93.2 fL (ref 80.0–100.0)
MPV: 11 fL (ref 7.5–12.5)
Monocytes Relative: 8.3 %
Neutro Abs: 4284 cells/uL (ref 1500–7800)
Neutrophils Relative %: 51 %
Platelets: 341 10*3/uL (ref 140–400)
RBC: 3.85 10*6/uL (ref 3.80–5.10)
RDW: 12 % (ref 11.0–15.0)
Total Lymphocyte: 36.8 %
WBC: 8.4 10*3/uL (ref 3.8–10.8)

## 2019-11-13 LAB — TSH: TSH: 2.31 mIU/L (ref 0.40–4.50)

## 2019-11-15 ENCOUNTER — Ambulatory Visit (INDEPENDENT_AMBULATORY_CARE_PROVIDER_SITE_OTHER): Payer: Medicare Other | Admitting: Internal Medicine

## 2019-11-15 ENCOUNTER — Encounter: Payer: Self-pay | Admitting: Internal Medicine

## 2019-11-15 ENCOUNTER — Other Ambulatory Visit: Payer: Self-pay

## 2019-11-15 VITALS — BP 110/60 | HR 63 | Ht 64.0 in | Wt 149.0 lb

## 2019-11-15 DIAGNOSIS — Z5181 Encounter for therapeutic drug level monitoring: Secondary | ICD-10-CM

## 2019-11-15 DIAGNOSIS — Z9882 Breast implant status: Secondary | ICD-10-CM

## 2019-11-15 DIAGNOSIS — K5120 Ulcerative (chronic) proctitis without complications: Secondary | ICD-10-CM | POA: Diagnosis not present

## 2019-11-15 DIAGNOSIS — I10 Essential (primary) hypertension: Secondary | ICD-10-CM | POA: Diagnosis not present

## 2019-11-15 DIAGNOSIS — E781 Pure hyperglyceridemia: Secondary | ICD-10-CM | POA: Diagnosis not present

## 2019-11-15 DIAGNOSIS — Z79890 Hormone replacement therapy (postmenopausal): Secondary | ICD-10-CM

## 2019-11-15 DIAGNOSIS — Z0000 Encounter for general adult medical examination without abnormal findings: Secondary | ICD-10-CM | POA: Diagnosis not present

## 2019-11-15 DIAGNOSIS — E039 Hypothyroidism, unspecified: Secondary | ICD-10-CM

## 2019-11-15 LAB — POCT URINALYSIS DIPSTICK
Appearance: NEGATIVE
Bilirubin, UA: NEGATIVE
Blood, UA: NEGATIVE
Glucose, UA: NEGATIVE
Ketones, UA: NEGATIVE
Leukocytes, UA: NEGATIVE
Nitrite, UA: NEGATIVE
Odor: NEGATIVE
Protein, UA: NEGATIVE
Spec Grav, UA: 1.015 (ref 1.010–1.025)
Urobilinogen, UA: 0.2 E.U./dL
pH, UA: 6.5 (ref 5.0–8.0)

## 2019-11-15 NOTE — Progress Notes (Signed)
Subjective:    [name], female    DOB: [birth year], 79 y.o.   MRN: [id number]  HPI 79 year old Female for health maintenance  exam, Medicare wellness, and evaluation of medical issues. She has a history of hypertension, hyperlipidemia, hypothyroidism, estrogen replacement and GE reflux.  Had local reaction to Prevnar in 2017.  Her arm was hot and swollen.  She had pneumococcal 23 vaccine in 2013.  She had 2 Pfizer COVID-19 immunizations without issue.  Had Zostavax vaccine in 2011.  Order written for Shingrix vaccine.  She saw Dr. Fuller Plan for colonoscopy because of urge to defecate in 2015.  Has been treated with Canasa for mild chronic active colitis.  Currently not taking the medication.  Uses been table for episodic diarrhea after meals.  Had photosensitivity reaction to [facility] and therefore takes Lozol.  Amlodipine causes leg swelling.  Does not like Hycodan for cough but can take Tussionex.  In 2007 had negative Cardiolite study.  Had apparent viral gastroenteritis in April 2020 that took several days to resolve.  Was treated with antinausea medication and had a sudden onset.  Had ophthalmic migraine in 2010.  Had herpes zoster 2001.  Hysterectomy with unioophorectomy 1977.  History of bilateral breast implants 1991.  Surgery for herniated lumbar disc L5-S1 in 1998.  Social history: She is married.  Has 3 adult children.  Husband worked prior to retirement in Scientist, research (life sciences) estate and in Leisure centre manager.  She formerly worked as a Herbalist.  She and her husband moved here in 30 from [place].  She quit smoking in 1998.  She drinks wine socially.  They are both retired.  Family history: Father died of sudden cardiac arrest with history of defibrillator, congestive heart failure, MI and CABG.  1 brother died of an MI.  3 brothers living, 1 with history of Crohn's disease.  Another brother died of unknown causes but had history of infection and alcohol abuse.   Mother living in her 5s.   Review of Systems  Constitutional: Negative.   All other systems reviewed and are negative.      Objective:   Physical Exam Blood pressure 110/60 pulse 63 pulse oximetry 98% weight 149 pounds BMI 25.58  Skin warm and dry.  No cervical adenopathy.  No thyromegaly.  No carotid bruits.  Chest clear to auscultation.  Cardiac exam regular rate and rhythm normal S1 and S2 without murmurs or gallops.  She has bilateral breast implants.  Abdomen soft nondistended without hepatosplenomegaly masses or tenderness.  Pelvic exam no masses on bimanual exam.  Pap deferred due to age.  No lower extremity edema.  Neuro: Intact without focal deficits.  She is alert and oriented x3.       Assessment & Plan:  Health maintenance-last mammogram was in 2018-she has bilateral implants.  Order will be placed.  Last colonoscopy was in 2015.  Canasa was prescribed and repeat study recommended in 10 years.  Study was performed by Dr. Fuller Plan.  Essential hypertension-stable on current medication consisting of Lozol, amlodipine and losartan as well as metoprolol  Estrogen replacement-remains on Premarin  Hypothyroidism-stable on thyroid replacement  Plan: Recommend colonoscopy.  Order written for Shingrix vaccine.  Follow-up in 1 year or as needed.  CBC c-Met lipid panel and TSH are entirely within normal limits.  Dipstick UA is normal.  Subjective:   Patient presents for Medicare Annual/Subsequent preventive examination.  Review Past Medical/Family/Social: See above   Risk Factors  Current  exercise habits: Walks Dietary issues discussed: Low-fat low carbohydrate advised  Cardiac risk factors: Hyperlipidemia, hypertension, family history  Depression Screen  (Note: if answer to either of the following is "Yes", a more complete depression screening is indicated)   Over the past two weeks, have you felt down, depressed or hopeless? No  Over the past two weeks, have you felt  little interest or pleasure in doing things? No Have you lost interest or pleasure in daily life? No Do you often feel hopeless? No Do you cry easily over simple problems? No   Activities of Daily Living  In your present state of health, do you have any difficulty performing the following activities?:   Driving? No  Managing money? No  Feeding yourself? No  Getting from bed to chair? No  Climbing a flight of stairs? No  Preparing food and eating?: No  Bathing or showering? No  Getting dressed: No  Getting to the toilet? No  Using the toilet:No  Moving around from place to place: No  In the past year have you fallen or had a near fall?:No  Are you sexually active? No  Do you have more than one partner? No   Hearing Difficulties: No  Do you often ask people to speak up or repeat themselves? No  Do you experience ringing or noises in your ears? No  Do you have difficulty understanding soft or whispered voices? No  Do you feel that you have a problem with memory? No Do you often misplace items? No    Home Safety:  Do you have a smoke alarm at your residence? Yes Do you have grab bars in the bathroom?  Yes Do you have throw rugs in your house?  No   Cognitive Testing  Alert? Yes Normal Appearance?Yes  Oriented to person? Yes Place? Yes  Time? Yes  Recall of three objects? Yes  Can perform simple calculations? Yes  Displays appropriate judgment?Yes  Can read the correct time from a watch face?Yes   List the Names of Other Physician/Practitioners you currently use:  See referral list for the physicians patient is currently seeing.     Review of Systems: See above   Objective:     General appearance: Appears stated age and trim Head: Normocephalic, without obvious abnormality, atraumatic  Eyes: conj clear, EOMi PEERLA  Ears: normal TM's and external ear canals both ears  Nose: Nares normal. Septum midline. Mucosa normal. No drainage or sinus tenderness.  Throat:  lips, mucosa, and tongue normal; teeth and gums normal  Neck: no adenopathy, no carotid bruit, no JVD, supple, symmetrical, trachea midline and thyroid not enlarged, symmetric, no tenderness/mass/nodules  No CVA tenderness.  Lungs: clear to auscultation bilaterally  Breasts: normal appearance, no masses or tenderness Heart: regular rate and rhythm, S1, S2 normal, no murmur, click, rub or gallop  Abdomen: soft, non-tender; bowel sounds normal; no masses, no organomegaly  Musculoskeletal: ROM normal in all joints, no crepitus, no deformity, Normal muscle strengthen. Back  is symmetric, no curvature. Skin: Skin color, texture, turgor normal. No rashes or lesions  Lymph nodes: Cervical, supraclavicular, and axillary nodes normal.  Neurologic: CN 2 -12 Normal, Normal symmetric reflexes. Normal coordination and gait  Psych: Alert & Oriented x 3, Mood appear stable.    Assessment:    Annual wellness medicare exam   Plan:    During the course of the visit the patient was educated and counseled about appropriate screening and preventive services including:   Ordered  mammogram  Recommend annual flu vaccine  Order written for Shingrix     Patient Instructions (the written plan) was given to the patient.  Medicare Attestation  I have personally reviewed:  The patient's medical and social history  Their use of alcohol, tobacco or illicit drugs  Their current medications and supplements  The patient's functional ability including ADLs,fall risks, home safety risks, cognitive, and hearing and visual impairment  Diet and physical activities  Evidence for depression or mood disorders  The patient's weight, height, BMI, and visual acuity have been recorded in the chart. I have made referrals, counseling, and provided education to the patient based on review of the above and I have provided the patient with a written personalized care plan for preventive services.

## 2019-11-15 NOTE — Patient Instructions (Signed)
It was a pleasure to see you today.  No change in medications.  Have mammogram.  Follow-up in 1 year or as needed.

## 2019-11-16 ENCOUNTER — Other Ambulatory Visit: Payer: Self-pay

## 2019-11-16 DIAGNOSIS — Z1231 Encounter for screening mammogram for malignant neoplasm of breast: Secondary | ICD-10-CM

## 2019-11-22 ENCOUNTER — Other Ambulatory Visit: Payer: Self-pay | Admitting: Internal Medicine

## 2019-12-09 ENCOUNTER — Other Ambulatory Visit: Payer: Self-pay | Admitting: Internal Medicine

## 2020-01-06 DIAGNOSIS — Z1231 Encounter for screening mammogram for malignant neoplasm of breast: Secondary | ICD-10-CM | POA: Diagnosis not present

## 2020-01-06 LAB — HM MAMMOGRAPHY

## 2020-01-07 ENCOUNTER — Encounter: Payer: Self-pay | Admitting: Internal Medicine

## 2020-01-24 ENCOUNTER — Other Ambulatory Visit: Payer: Self-pay | Admitting: Internal Medicine

## 2020-01-24 DIAGNOSIS — E039 Hypothyroidism, unspecified: Secondary | ICD-10-CM

## 2020-03-28 ENCOUNTER — Other Ambulatory Visit: Payer: Self-pay | Admitting: Internal Medicine

## 2020-03-28 NOTE — Telephone Encounter (Signed)
Book CPE for June 2022 and pend meds. I will sign tomorrow

## 2020-04-05 NOTE — Telephone Encounter (Signed)
CPE has been scheduled

## 2020-06-26 DIAGNOSIS — H5203 Hypermetropia, bilateral: Secondary | ICD-10-CM | POA: Diagnosis not present

## 2020-06-26 DIAGNOSIS — H2513 Age-related nuclear cataract, bilateral: Secondary | ICD-10-CM | POA: Diagnosis not present

## 2020-06-26 DIAGNOSIS — H52203 Unspecified astigmatism, bilateral: Secondary | ICD-10-CM | POA: Diagnosis not present

## 2020-06-26 DIAGNOSIS — H33303 Unspecified retinal break, bilateral: Secondary | ICD-10-CM | POA: Diagnosis not present

## 2020-06-29 ENCOUNTER — Other Ambulatory Visit: Payer: Self-pay | Admitting: Internal Medicine

## 2020-06-29 DIAGNOSIS — E039 Hypothyroidism, unspecified: Secondary | ICD-10-CM

## 2020-09-13 ENCOUNTER — Other Ambulatory Visit: Payer: Self-pay | Admitting: Internal Medicine

## 2020-10-15 ENCOUNTER — Other Ambulatory Visit: Payer: Self-pay | Admitting: Internal Medicine

## 2020-11-09 ENCOUNTER — Other Ambulatory Visit: Payer: Self-pay | Admitting: Internal Medicine

## 2020-11-13 ENCOUNTER — Other Ambulatory Visit: Payer: Medicare Other | Admitting: Internal Medicine

## 2020-11-13 ENCOUNTER — Other Ambulatory Visit: Payer: Self-pay

## 2020-11-13 DIAGNOSIS — E039 Hypothyroidism, unspecified: Secondary | ICD-10-CM

## 2020-11-13 DIAGNOSIS — I10 Essential (primary) hypertension: Secondary | ICD-10-CM

## 2020-11-13 DIAGNOSIS — E781 Pure hyperglyceridemia: Secondary | ICD-10-CM

## 2020-11-13 DIAGNOSIS — Z0000 Encounter for general adult medical examination without abnormal findings: Secondary | ICD-10-CM

## 2020-11-13 DIAGNOSIS — K5120 Ulcerative (chronic) proctitis without complications: Secondary | ICD-10-CM | POA: Diagnosis not present

## 2020-11-14 LAB — CBC WITH DIFFERENTIAL/PLATELET
Absolute Monocytes: 670 cells/uL (ref 200–950)
Basophils Absolute: 61 cells/uL (ref 0–200)
Basophils Relative: 0.7 %
Eosinophils Absolute: 209 cells/uL (ref 15–500)
Eosinophils Relative: 2.4 %
HCT: 34.9 % — ABNORMAL LOW (ref 35.0–45.0)
Hemoglobin: 11.5 g/dL — ABNORMAL LOW (ref 11.7–15.5)
Lymphs Abs: 2906 cells/uL (ref 850–3900)
MCH: 31.3 pg (ref 27.0–33.0)
MCHC: 33 g/dL (ref 32.0–36.0)
MCV: 94.8 fL (ref 80.0–100.0)
MPV: 10.7 fL (ref 7.5–12.5)
Monocytes Relative: 7.7 %
Neutro Abs: 4855 cells/uL (ref 1500–7800)
Neutrophils Relative %: 55.8 %
Platelets: 292 10*3/uL (ref 140–400)
RBC: 3.68 10*6/uL — ABNORMAL LOW (ref 3.80–5.10)
RDW: 12.6 % (ref 11.0–15.0)
Total Lymphocyte: 33.4 %
WBC: 8.7 10*3/uL (ref 3.8–10.8)

## 2020-11-14 LAB — COMPLETE METABOLIC PANEL WITH GFR
AG Ratio: 1.3 (calc) (ref 1.0–2.5)
ALT: 15 U/L (ref 6–29)
AST: 20 U/L (ref 10–35)
Albumin: 4.3 g/dL (ref 3.6–5.1)
Alkaline phosphatase (APISO): 107 U/L (ref 37–153)
BUN/Creatinine Ratio: 26 (calc) — ABNORMAL HIGH (ref 6–22)
BUN: 25 mg/dL (ref 7–25)
CO2: 34 mmol/L — ABNORMAL HIGH (ref 20–32)
Calcium: 10.6 mg/dL — ABNORMAL HIGH (ref 8.6–10.4)
Chloride: 100 mmol/L (ref 98–110)
Creat: 0.98 mg/dL — ABNORMAL HIGH (ref 0.60–0.93)
GFR, Est African American: 64 mL/min/{1.73_m2} (ref >=60)
GFR, Est Non African American: 56 mL/min/{1.73_m2} — ABNORMAL LOW (ref >=60)
Globulin: 3.4 g/dL (calc) (ref 1.9–3.7)
Glucose, Bld: 93 mg/dL (ref 65–99)
Potassium: 4.3 mmol/L (ref 3.5–5.3)
Sodium: 140 mmol/L (ref 135–146)
Total Bilirubin: 0.4 mg/dL (ref 0.2–1.2)
Total Protein: 7.7 g/dL (ref 6.1–8.1)

## 2020-11-14 LAB — LIPID PANEL
Cholesterol: 183 mg/dL (ref <200)
HDL: 84 mg/dL (ref >=50)
LDL Cholesterol (Calc): 78 mg/dL (calc)
Non-HDL Cholesterol (Calc): 99 mg/dL (calc) (ref <130)
Total CHOL/HDL Ratio: 2.2 (calc) (ref <5.0)
Triglycerides: 130 mg/dL (ref <150)

## 2020-11-14 LAB — TSH: TSH: 3.52 mIU/L (ref 0.40–4.50)

## 2020-11-16 ENCOUNTER — Other Ambulatory Visit: Payer: Self-pay

## 2020-11-16 ENCOUNTER — Ambulatory Visit (INDEPENDENT_AMBULATORY_CARE_PROVIDER_SITE_OTHER): Payer: Medicare Other | Admitting: Internal Medicine

## 2020-11-16 ENCOUNTER — Encounter: Payer: Self-pay | Admitting: Internal Medicine

## 2020-11-16 ENCOUNTER — Telehealth: Payer: Self-pay | Admitting: Internal Medicine

## 2020-11-16 VITALS — BP 130/80 | HR 80 | Ht 64.0 in | Wt 149.0 lb

## 2020-11-16 DIAGNOSIS — Z1211 Encounter for screening for malignant neoplasm of colon: Secondary | ICD-10-CM | POA: Diagnosis not present

## 2020-11-16 DIAGNOSIS — K5120 Ulcerative (chronic) proctitis without complications: Secondary | ICD-10-CM | POA: Diagnosis not present

## 2020-11-16 DIAGNOSIS — E039 Hypothyroidism, unspecified: Secondary | ICD-10-CM | POA: Diagnosis not present

## 2020-11-16 DIAGNOSIS — Z0000 Encounter for general adult medical examination without abnormal findings: Secondary | ICD-10-CM

## 2020-11-16 DIAGNOSIS — E781 Pure hyperglyceridemia: Secondary | ICD-10-CM | POA: Diagnosis not present

## 2020-11-16 DIAGNOSIS — I10 Essential (primary) hypertension: Secondary | ICD-10-CM

## 2020-11-16 LAB — POCT URINALYSIS DIPSTICK
Appearance: NEGATIVE
Bilirubin, UA: NEGATIVE
Blood, UA: NEGATIVE
Glucose, UA: NEGATIVE
Ketones, UA: NEGATIVE
Leukocytes, UA: NEGATIVE
Nitrite, UA: NEGATIVE
Odor: NEGATIVE
Protein, UA: NEGATIVE
Spec Grav, UA: 1.01 (ref 1.010–1.025)
Urobilinogen, UA: 0.2 E.U./dL
pH, UA: 6.5 (ref 5.0–8.0)

## 2020-11-16 LAB — HEMOCCULT GUIAC POC 1CARD (OFFICE): Fecal Occult Blood, POC: NEGATIVE

## 2020-11-16 NOTE — Progress Notes (Signed)
Subjective:    [name], female    DOB: [birth year], 80 y.o.   MRN: [id number]  HPI 80 year old Female for Medicare wellness, health maintenance exam and evaluation of medical issues.  Scheduled for cataract extractions in September.  She has a history of hypertension, hyperlipidemia, hypothyroidism, estrogen replacement and GE reflux.  Had local reaction to Prevnar in 2017.  Her arm was hot and swollen.  She had previously had pneumococcal 23 vaccine in 2013.  Has had 3 COVID vaccines without issue.  Zostavax vaccine given in 2011.  Okay to get Shingrix vaccine when she feels comfortable.  She saw Dr. Fuller Plan for colonoscopy because of urge to defecate in 2015.  Has been treated with Canasa for mild chronic active colitis but currently not using the medication.  Has taken Bentyl for episodic diarrhea after meals.   Colonoscopy done 2015 with 10 year follow up recommended  Tetanus is up to date  In 2007 had a negative Cardiolite study.  Has had photosensitivity reaction to Maxalt many years ago and therefore takes Lozol.  Amlodipine causes leg swelling.  Does not like Hycodan for cough but can take Tussionex.  Had apparent viral gastroenteritis in April 2020 that took several days to resolve.  Was treated with antinausea medication and had a sudden onset.  Had ophthalmic migraine in 2010.  Herpes zoster 2001.  Hysterectomy with any oophorectomy 1977.  Bilateral breast implants 1991.  Surgery for herniated lumbar disc L5-S1 in 1998.  Social history: She is married.  3 adult children.  Husband works prior to retirement entering the state in Leisure centre manager.  She formerly worked as a Herbalist.  She and her husband moved here in 34 from [place].  She quit smoking in 1998.  She drinks wine socially.  They are both retired now.  Family history: Father died of sudden cardiac arrest with history of defibrillator implant, congestive heart failure, MI and  CABG.  1 brother died of an MI.  3 brothers living.  1 brother with history of Crohn's disease.  Another brother died of unknown causes but had history of infection and alcohol abuse.  Mother living in her 83s.  Review of Systems no new complaints     Objective:   Physical Exam BP 130/80 pulse 80 pulse 97% weight 149 pounds Weight 25.58  She weighed 149 last year.  Skin: Warm and dry.  No cervical adenopathy.  No thyromegaly.  No carotid bruits.  TMs clear.  Neck is supple.  Chest is clear to auscultation.  Breast: Bilateral implants; cardiac exam: Regular rate and rhythm normal S1 and S2 without murmurs or gallops.  Abdomen soft nondistended without hepatosplenomegaly masses or tenderness.  Pelvic exam: No masses on bimanual exam.  Pap deferred due to age.  No lower extremity edema.  Neuro: Intact without focal deficits.  She is alert and oriented x3.  Affect thought and judgment appear to be normal.  Hemoccult card x1 is normal.     Assessment & Plan:  Bilateral breast implants stage -had mammogram in August 2021  Last colonoscopy was 2015 and repeat study recommended in 10 years by Dr. Fuller Plan.  Could not also was prescribed at that time but patient is not taking that.  Essential hypertension stable on Cozaar, metoprolol, Lozol.  Hypothyroidism stable on thyroid replacement medication.  TSH 3.52.  History of hypertriglyceridemia-is on low-dose Lipitor 10 mg daily and lipids are stable and within normal limits.  Plan:  Follow-up in 1 year or as needed.  Colonoscopy not due until 2025 per Dr. Lynne Leader notes previously.  However fecal occult blood testing x1 done today and was negative.  She has follow up on calcium in late September. This was discussed with her by phone July 30th. She is asymptomatic.    Subjective:   Patient presents for Medicare Annual/Subsequent preventive examination.  Review Past Medical/Family/Social: See above   Risk Factors  Current exercise habits:  Active Dietary issues discussed: Yes low-fat low-carb  Cardiac risk factors: Hypertriglyceridemia  Depression Screen  (Note: if answer to either of the following is "Yes", a more complete depression screening is indicated)   Over the past two weeks, have you felt down, depressed or hopeless? No  Over the past two weeks, have you felt little interest or pleasure in doing things? No Have you lost interest or pleasure in daily life? No Do you often feel hopeless? No Do you cry easily over simple problems? No   Activities of Daily Living  In your present state of health, do you have any difficulty performing the following activities?:   Driving? No  Managing money? No  Feeding yourself? No  Getting from bed to chair? No  Climbing a flight of stairs? No  Preparing food and eating?: No  Bathing or showering? No  Getting dressed: No  Getting to the toilet? No  Using the toilet:No  Moving around from place to place: No  In the past year have you fallen or had a near fall?:No  Are you sexually active? No  Do you have more than one partner? No   Hearing Difficulties: No  Do you often ask people to speak up or repeat themselves? No  Do you experience ringing or noises in your ears? No  Do you have difficulty understanding soft or whispered voices? No  Do you feel that you have a problem with memory? No Do you often misplace items? No    Home Safety:  Do you have a smoke alarm at your residence? Yes Do you have grab bars in the bathroom?  Yes Do you have throw rugs in your house?  Yes   Cognitive Testing  Alert? Yes Normal Appearance?Yes  Oriented to person? Yes Place? Yes  Time? Yes  Recall of three objects? Yes  Can perform simple calculations? Yes  Displays appropriate judgment?Yes  Can read the correct time from a watch face?Yes   List the Names of Other Physician/Practitioners you currently use:  See referral list for the physicians patient is currently seeing.  None  currently  Has seen Dr. Fuller Plan in the remote past   Review of Systems: See above   Objective:     General appearance: Appears stated age and thin Head: Normocephalic, without obvious abnormality, atraumatic  Eyes: conj clear, EOMi PEERLA  Ears: normal TM's and external ear canals both ears  Nose: Nares normal. Septum midline. Mucosa normal. No drainage or sinus tenderness.  Throat: lips, mucosa, and tongue normal; teeth and gums normal  Neck: no adenopathy, no carotid bruit, no JVD, supple, symmetrical, trachea midline and thyroid not enlarged, symmetric, no tenderness/mass/nodules  No CVA tenderness.  Lungs: clear to auscultation bilaterally  Breasts: Bilateral breast implants.   Heart: regular rate and rhythm, S1, S2 normal, no murmur, click, rub or gallop  Abdomen: soft, non-tender; bowel sounds normal; no masses, no organomegaly  Musculoskeletal: ROM normal in all joints, no crepitus, no deformity, Normal muscle strengthen. Back  is symmetric, no  curvature. Skin: Skin color, texture, turgor normal. No rashes or lesions  Lymph nodes: Cervical, supraclavicular, and axillary nodes normal.  Neurologic: CN 2 -12 Normal, Normal symmetric reflexes. Normal coordination and gait  Psych: Alert & Oriented x 3, Mood appear stable.    Assessment:    Annual wellness medicare exam   Plan:    During the course of the visit the patient was educated and counseled about appropriate screening and preventive services including:   Will order Cologuard  Had mammogram in 2021 and Radiology advises this to be done annually       Patient Instructions (the written plan) was given to the patient.  Medicare Attestation  I have personally reviewed:  The patient's medical and social history  Their use of alcohol, tobacco or illicit drugs  Their current medications and supplements  The patient's functional ability including ADLs,fall risks, home safety risks, cognitive, and hearing and visual  impairment  Diet and physical activities  Evidence for depression or mood disorders  The patient's weight, height, BMI, and visual acuity have been recorded in the chart. I have made referrals, counseling, and provided education to the patient based on review of the above and I have provided the patient with a written personalized care plan for preventive services.

## 2020-11-16 NOTE — Telephone Encounter (Signed)
Kristen Hopkins [phone]  Kristen called to say her COVID Booster was 03/02/2020 [employer]

## 2020-12-23 NOTE — Patient Instructions (Addendum)
It was a pleasure to see today.  You have no symptoms of hypercalcemia but her serum calcium is elevated.  Return in September for follow-up on elevated serum calcium.  Your Hemoccult card was negative for occult blood/colon cancer screening today.  Recommend annual mammogram.  We will plan to see you annually

## 2021-01-08 ENCOUNTER — Other Ambulatory Visit: Payer: Medicare Other | Admitting: Internal Medicine

## 2021-01-09 ENCOUNTER — Other Ambulatory Visit: Payer: Self-pay | Admitting: Internal Medicine

## 2021-01-11 ENCOUNTER — Other Ambulatory Visit: Payer: Medicare Other | Admitting: Internal Medicine

## 2021-01-27 ENCOUNTER — Other Ambulatory Visit: Payer: Self-pay | Admitting: Internal Medicine

## 2021-02-01 ENCOUNTER — Other Ambulatory Visit: Payer: Medicare Other | Admitting: Internal Medicine

## 2021-02-01 ENCOUNTER — Other Ambulatory Visit: Payer: Self-pay

## 2021-02-01 LAB — CALCIUM: Calcium: 9.8 mg/dL (ref 8.6–10.4)

## 2021-02-05 DIAGNOSIS — H2513 Age-related nuclear cataract, bilateral: Secondary | ICD-10-CM | POA: Diagnosis not present

## 2021-02-19 ENCOUNTER — Other Ambulatory Visit: Payer: Medicare Other | Admitting: Internal Medicine

## 2021-02-27 DIAGNOSIS — H21562 Pupillary abnormality, left eye: Secondary | ICD-10-CM | POA: Diagnosis not present

## 2021-02-27 DIAGNOSIS — H25812 Combined forms of age-related cataract, left eye: Secondary | ICD-10-CM | POA: Diagnosis not present

## 2021-02-27 DIAGNOSIS — H2512 Age-related nuclear cataract, left eye: Secondary | ICD-10-CM | POA: Diagnosis not present

## 2021-03-27 ENCOUNTER — Other Ambulatory Visit: Payer: Self-pay | Admitting: Internal Medicine

## 2021-03-27 DIAGNOSIS — E039 Hypothyroidism, unspecified: Secondary | ICD-10-CM

## 2021-04-17 DIAGNOSIS — H2511 Age-related nuclear cataract, right eye: Secondary | ICD-10-CM | POA: Diagnosis not present

## 2021-04-17 DIAGNOSIS — H21561 Pupillary abnormality, right eye: Secondary | ICD-10-CM | POA: Diagnosis not present

## 2021-04-26 HISTORY — PX: CATARACT EXTRACTION, BILATERAL: SHX1313

## 2021-06-20 ENCOUNTER — Other Ambulatory Visit: Payer: Self-pay | Admitting: Internal Medicine

## 2021-07-07 ENCOUNTER — Other Ambulatory Visit: Payer: Self-pay | Admitting: Internal Medicine

## 2021-09-04 ENCOUNTER — Other Ambulatory Visit: Payer: Self-pay | Admitting: Internal Medicine

## 2021-11-11 ENCOUNTER — Other Ambulatory Visit: Payer: Self-pay | Admitting: Internal Medicine

## 2021-11-12 ENCOUNTER — Other Ambulatory Visit: Payer: Self-pay | Admitting: Internal Medicine

## 2021-11-13 ENCOUNTER — Other Ambulatory Visit: Payer: Medicare Other

## 2021-11-19 ENCOUNTER — Encounter: Payer: Medicare Other | Admitting: Internal Medicine

## 2021-11-19 ENCOUNTER — Other Ambulatory Visit: Payer: Medicare Other

## 2021-11-19 DIAGNOSIS — E7849 Other hyperlipidemia: Secondary | ICD-10-CM | POA: Diagnosis not present

## 2021-11-19 DIAGNOSIS — R5383 Other fatigue: Secondary | ICD-10-CM

## 2021-11-19 DIAGNOSIS — I10 Essential (primary) hypertension: Secondary | ICD-10-CM

## 2021-11-20 LAB — CBC WITH DIFFERENTIAL/PLATELET
Absolute Monocytes: 724 cells/uL (ref 200–950)
Basophils Absolute: 82 cells/uL (ref 0–200)
Basophils Relative: 0.8 %
Eosinophils Absolute: 265 cells/uL (ref 15–500)
Eosinophils Relative: 2.6 %
HCT: 36.7 % (ref 35.0–45.0)
Hemoglobin: 11.9 g/dL (ref 11.7–15.5)
Lymphs Abs: 3386 cells/uL (ref 850–3900)
MCH: 30.5 pg (ref 27.0–33.0)
MCHC: 32.4 g/dL (ref 32.0–36.0)
MCV: 94.1 fL (ref 80.0–100.0)
MPV: 10.6 fL (ref 7.5–12.5)
Monocytes Relative: 7.1 %
Neutro Abs: 5743 cells/uL (ref 1500–7800)
Neutrophils Relative %: 56.3 %
Platelets: 337 10*3/uL (ref 140–400)
RBC: 3.9 10*6/uL (ref 3.80–5.10)
RDW: 12 % (ref 11.0–15.0)
Total Lymphocyte: 33.2 %
WBC: 10.2 10*3/uL (ref 3.8–10.8)

## 2021-11-20 LAB — COMPLETE METABOLIC PANEL WITH GFR
AG Ratio: 1.4 (calc) (ref 1.0–2.5)
ALT: 20 U/L (ref 6–29)
AST: 22 U/L (ref 10–35)
Albumin: 4.5 g/dL (ref 3.6–5.1)
Alkaline phosphatase (APISO): 116 U/L (ref 37–153)
BUN/Creatinine Ratio: 28 (calc) — ABNORMAL HIGH (ref 6–22)
BUN: 29 mg/dL — ABNORMAL HIGH (ref 7–25)
CO2: 32 mmol/L (ref 20–32)
Calcium: 11 mg/dL — ABNORMAL HIGH (ref 8.6–10.4)
Chloride: 98 mmol/L (ref 98–110)
Creat: 1.05 mg/dL — ABNORMAL HIGH (ref 0.60–1.00)
Globulin: 3.2 g/dL (calc) (ref 1.9–3.7)
Glucose, Bld: 96 mg/dL (ref 65–99)
Potassium: 4 mmol/L (ref 3.5–5.3)
Sodium: 139 mmol/L (ref 135–146)
Total Bilirubin: 0.5 mg/dL (ref 0.2–1.2)
Total Protein: 7.7 g/dL (ref 6.1–8.1)
eGFR: 54 mL/min/{1.73_m2} — ABNORMAL LOW (ref >=60)

## 2021-11-20 LAB — LIPID PANEL
Cholesterol: 181 mg/dL (ref <200)
HDL: 66 mg/dL (ref >=50)
LDL Cholesterol (Calc): 89 mg/dL (calc)
Non-HDL Cholesterol (Calc): 115 mg/dL (calc) (ref <130)
Total CHOL/HDL Ratio: 2.7 (calc) (ref <5.0)
Triglycerides: 162 mg/dL — ABNORMAL HIGH (ref <150)

## 2021-11-20 LAB — TSH: TSH: 1.74 mIU/L (ref 0.40–4.50)

## 2021-11-26 ENCOUNTER — Other Ambulatory Visit: Payer: Self-pay | Admitting: Internal Medicine

## 2021-11-30 ENCOUNTER — Encounter: Payer: Self-pay | Admitting: Internal Medicine

## 2021-11-30 ENCOUNTER — Ambulatory Visit (INDEPENDENT_AMBULATORY_CARE_PROVIDER_SITE_OTHER): Payer: Medicare Other | Admitting: Internal Medicine

## 2021-11-30 VITALS — BP 128/70 | HR 71 | Temp 97.6°F | Ht 64.5 in | Wt 142.8 lb

## 2021-11-30 DIAGNOSIS — I10 Essential (primary) hypertension: Secondary | ICD-10-CM | POA: Diagnosis not present

## 2021-11-30 DIAGNOSIS — Z0000 Encounter for general adult medical examination without abnormal findings: Secondary | ICD-10-CM

## 2021-11-30 DIAGNOSIS — K5120 Ulcerative (chronic) proctitis without complications: Secondary | ICD-10-CM | POA: Diagnosis not present

## 2021-11-30 DIAGNOSIS — R82998 Other abnormal findings in urine: Secondary | ICD-10-CM

## 2021-11-30 DIAGNOSIS — E7800 Pure hypercholesterolemia, unspecified: Secondary | ICD-10-CM

## 2021-11-30 DIAGNOSIS — E039 Hypothyroidism, unspecified: Secondary | ICD-10-CM

## 2021-11-30 LAB — POCT URINALYSIS DIPSTICK
Bilirubin, UA: NEGATIVE
Blood, UA: NEGATIVE
Glucose, UA: NEGATIVE
Ketones, UA: NEGATIVE
Nitrite, UA: NEGATIVE
Protein, UA: NEGATIVE
Spec Grav, UA: 1.015 (ref 1.010–1.025)
Urobilinogen, UA: 0.2 E.U./dL
pH, UA: 5 (ref 5.0–8.0)

## 2021-11-30 NOTE — Patient Instructions (Signed)
It was a pleasure to see you today. Coronary calcium scoring ordered. Continue same medications.

## 2021-11-30 NOTE — Progress Notes (Signed)
Annual Wellness Visit     Patient: Kristen Hopkins, Female    DOB: 29-Jun-1940, 81 y.o.   MRN: [id number] Visit Date: 11/30/2021  Chief Complaint  Patient presents with   Medicare Wellness   Subjective    Kristen Hopkins is a 81 y.o. female who presents today for her Annual Wellness Visit.  HPI 81 year old Female seen for health maintenance exam, Medicare wellness and evaluation of medical issues.  Coronary calcium scoring discussed. Is on statin medication for history of hyperlipidemia.  Triglycerides are slightly elevated at 162 and were 130 a year ago.  She is agreeable to this.    Longstanding history of hypertension.  Blood pressure is under excellent control at 128/70.  Her weight is excellent at 142 pounds 12 ounces and BMI is 24.12.  She has lost 7 pounds since June 2022.  She feels well.  Some mild arthralgias.  She has bilateral breast implants and does not want to have mammogram because of bruising and pain with previous mammograms with these implants.  Immunizations discussed.  Recommend that she have these at her local pharmacy.  Recommend COVID-vaccine for the fall.  She is now due for pneumococcal 20 vaccine and an update on tetanus immunization.  She has only had 1 zoster vaccine and that was in 2011.  She is a candidate for Shingrix vaccine.  She had colonoscopy in 2015.  Rectal biopsy showed mild active colitis.  She is currently asymptomatic.  She was treated with Canasa at that time but currently is not on it.  10-year follow-up was recommended for colon cancer screening.   Social History   Social History Narrative   Not on file    Patient Care Team: Thessaly Mccullers, Cresenciano Lick, MD as PCP - General (Internal Medicine)  Review of Systems   Objective    Vitals: There were no vitals taken for this visit.  Physical Exam   Most recent functional status assessment:    11/30/2021   10:46 AM  In your present state of health, do you have any difficulty performing the  following activities:  Hearing? 0  Vision? 0  Difficulty concentrating or making decisions? 0  Walking or climbing stairs? 0  Dressing or bathing? 0  Doing errands, shopping? 0  Preparing Food and eating ? N  Using the Toilet? N  In the past six months, have you accidently leaked urine? N  Do you have problems with loss of bowel control? N  Managing your Medications? N  Managing your Finances? N  Housekeeping or managing your Housekeeping? N   Most recent fall risk assessment:    11/30/2021   10:46 AM  Fall Risk   Falls in the past year? 0  Number falls in past yr: 0  Injury with Fall? 0  Risk for fall due to : No Fall Risks  Follow up Falls evaluation completed    Most recent depression screenings:    11/30/2021   10:46 AM 11/16/2020    2:12 PM  PHQ 2/9 Scores  PHQ - 2 Score 0 0   Most recent cognitive screening:    11/30/2021   10:47 AM  6CIT Screen  What Year? 0 points  What month? 0 points  What time? 0 points  Count back from 20 0 points  Months in reverse 0 points  Repeat phrase 0 points  Total Score 0 points       Assessment & Plan     Annual  wellness visit done today including the all of the following: Reviewed patient's Family Medical History Reviewed and updated list of patient's medical providers Assessment of cognitive impairment was done Assessed patient's functional ability Established a written schedule for health screening [place] Completed and Reviewed  Discussed health benefits of physical activity, and encouraged her to engage in regular exercise appropriate for her age and condition.         {provider attestation***:1}   Angus Seller, CMA

## 2021-12-01 LAB — URINE CULTURE
MICRO NUMBER:: 13617727
SPECIMEN QUALITY:: ADEQUATE

## 2021-12-03 LAB — PTH, INTACT AND CALCIUM
Calcium: 11.2 mg/dL — ABNORMAL HIGH (ref 8.6–10.4)
PTH: 8 pg/mL — ABNORMAL LOW (ref 16–77)

## 2021-12-17 ENCOUNTER — Other Ambulatory Visit: Payer: Medicare Other

## 2021-12-17 DIAGNOSIS — R799 Abnormal finding of blood chemistry, unspecified: Secondary | ICD-10-CM | POA: Diagnosis not present

## 2021-12-17 NOTE — Addendum Note (Signed)
Addended by: Angus Seller on: 12/17/2021 09:36 AM   Modules accepted: Orders

## 2021-12-18 LAB — BASIC METABOLIC PANEL
BUN: 24 mg/dL (ref 7–25)
CO2: 28 mmol/L (ref 20–32)
Calcium: 10.2 mg/dL (ref 8.6–10.4)
Chloride: 102 mmol/L (ref 98–110)
Creat: 0.97 mg/dL (ref 0.60–1.00)
Glucose, Bld: 91 mg/dL (ref 65–99)
Potassium: 4.6 mmol/L (ref 3.5–5.3)
Sodium: 141 mmol/L (ref 135–146)

## 2021-12-20 ENCOUNTER — Encounter: Payer: Self-pay | Admitting: Internal Medicine

## 2021-12-20 ENCOUNTER — Ambulatory Visit (INDEPENDENT_AMBULATORY_CARE_PROVIDER_SITE_OTHER): Payer: Medicare Other

## 2021-12-21 LAB — PTH, INTACT AND CALCIUM
Calcium: 10.3 mg/dL (ref 8.6–10.4)
PTH: 10 pg/mL — ABNORMAL LOW (ref 16–77)

## 2021-12-21 NOTE — Progress Notes (Unsigned)
Lab only 

## 2022-01-11 ENCOUNTER — Ambulatory Visit (HOSPITAL_COMMUNITY)
Admission: RE | Admit: 2022-01-11 | Discharge: 2022-01-11 | Disposition: A | Discharge status: Home / Self Care | Payer: Medicare Other | Source: Ambulatory Visit | Attending: Internal Medicine | Admitting: Internal Medicine

## 2022-01-11 DIAGNOSIS — E7800 Pure hypercholesterolemia, unspecified: Secondary | ICD-10-CM | POA: Insufficient documentation

## 2022-01-22 ENCOUNTER — Ambulatory Visit (INDEPENDENT_AMBULATORY_CARE_PROVIDER_SITE_OTHER): Payer: Medicare Other | Admitting: Internal Medicine

## 2022-01-22 ENCOUNTER — Encounter: Payer: Self-pay | Admitting: Internal Medicine

## 2022-01-22 VITALS — BP 122/78 | HR 75 | Temp 98.3°F | Wt 145.0 lb

## 2022-01-22 DIAGNOSIS — E039 Hypothyroidism, unspecified: Secondary | ICD-10-CM

## 2022-01-22 DIAGNOSIS — I10 Essential (primary) hypertension: Secondary | ICD-10-CM | POA: Diagnosis not present

## 2022-01-22 DIAGNOSIS — E7800 Pure hypercholesterolemia, unspecified: Secondary | ICD-10-CM | POA: Diagnosis not present

## 2022-01-22 MED ORDER — ATORVASTATIN CALCIUM 20 MG PO TABS: 20.0000 mg | ORAL_TABLET | Freq: Every day | ORAL | 3 refills | Status: DC

## 2022-01-22 NOTE — Patient Instructions (Addendum)
We are going to arrange for cardiology consultation with Dr. Johnsie Cancel to discuss your coronary calcium score.  It was a pleasure to  see you and meet your husband today.  Continue taking statin medication.  Continue low fat diet and exercise efforts.  Lets increase atorvastatin to 20 mg daily

## 2022-01-22 NOTE — Progress Notes (Signed)
   Subjective:    [name], female    DOB: August 08, 1940, 81 y.o.   MRN: [id number]  HPI 81 year old Female seen to discuss coronary calcium score. Her husband accompanies her today.  She has been a patient here for many years.  Her general health is excellent.  She has a history of hypertension, hyperlipidemia, hypothyroidism, estrogen replacement and GE reflux.  In 2007, she had a negative Cardiolite study.  Father had pacemaker and defibrillator apparently deceased of sudden cardiac arrest with history of MI and CABG, 1 brother died of an MI. Mother lived to be 48 years old.  Another brother died of unknown causes but had history of infection and alcohol abuse.  Patient has 3 additional brothers 1 of whom has Crohn's disease.  Social history: She is married.  3 adult children.  She formerly worked as a Herbalist.  She quit smoking in 1998.  She drinks wine socially.  She and her husband moved here from [facility] in 1994.  Additional history: Ophthalmic migraine in 2010, herpes zoster 2001.  Has been on estrogen replacement for many years.  Status post hysterectomy with Uni oophorectomy in 1977.  History of bilateral breast implants.  Surgery for herniated lumbar disc L5-S1 in 1998.  Walks dog for exercise around the neighbor maybe 1/4-1/2 mile twice a day.  Blood pressure excellent at 122/78 on current regimen.  Intermittently her serum calcium has been elevated.  2 months ago it was 11 but intact PTH drawn in July was low at 8.  In June, triglycerides were elevated at 162.  In June 2020 triglycerides were 177.  In March 2018 triglycerides were 162.  All other annual lipid panels have been within normal limits.  Review of Systems no chest pain or shortness of breath     Objective:   Physical Exam Blood pressure 122/78 pulse 75 regular temperature 98.3 degrees pulse oximetry 96% weight 145 pounds BMI 24.50  Patient was not examined today.  We had a  30-minute discussion regarding coronary calcium score, family history of heart disease, how to manage risk factors.  Suggest cardiology consultation with Dr. Johnsie Cancel and possible need for myocardial perfusion study.  Her chest CT is normal.  Aortic atherosclerosis noted on chest CT.  There is no right rib fracture and a pectus deformity.  Her coronary calcium score is 938.     Assessment & Plan:   Patient is agreeable to cardiology consultation.  Referral will be made.  Continue with statin medication and antihypertensive medications.  For now, we will increase atorvastatin from 10 to 20 mg daily pending Cardiology consultation.  Patient is aware she may need perfusion study.  Time spent reviewing chart, coronary calcium score, discussion with patient and her husband is 30 minutes

## 2022-03-12 ENCOUNTER — Other Ambulatory Visit: Payer: Self-pay | Admitting: Internal Medicine

## 2022-03-12 DIAGNOSIS — E039 Hypothyroidism, unspecified: Secondary | ICD-10-CM

## 2022-03-15 NOTE — Progress Notes (Signed)
CARDIOLOGY CONSULT NOTE       [name] MRN: [id number] DOB/AGE: Aug 08, 1940 81 y.o.  Admit date: (Not on file) Referring Physician: [place] Primary Physician: Elby Showers, MD Primary Cardiologist: New Reason for Consultation: Abnormal Calcium Score  Active Problems:   * No active hospital problems. *   HPI:  81 y.o. referred by Dr Renold Genta for high calcium score Reviewed study read by myself 01/14/22 Total score 938 involving all 3 vessels This was 72 st percentile for age/sex History of HLD, HTN, IBS and reflux.  On statin Takes synthroid for low thyroid No clinical chest pain dyspnea, palpitations or syncope Concern for family history with dad having PPM/AICD/CABG and brother dying of MI  Married 3 adult kids Retired from being Herbalist Quit smoking in 1998 She moved from [place] [facility] in [place] 11/19/21 HDL 66, Triglycerides 162 LDL 89 Lipitor increased to 20 mg on 01/22/22   ***  ROS All other systems reviewed and negative except as noted above  Past Medical History:  Diagnosis Date  . Herpes zoster   . Hyperlipidemia   . Hypertension   . IBS (irritable bowel syndrome)   . Reflux     Family History  Problem Relation Age of Onset  . Ulcerative colitis Brother   . COPD Brother   . CAD Father   . Hypertension Father     Social History   Socioeconomic History  . Marital status: Married    Spouse name: Not on file  . Number of children: Not on file  . Years of education: Not on file  . Highest education level: Not on file  Occupational History  . Not on file  Tobacco Use  . Smoking status: Former    Years: 25.00    Types: Cigarettes    Quit date: 06/10/1991    Years since quitting: 30.7  . Smokeless tobacco: Never  Substance and Sexual Activity  . Alcohol use: Yes    Comment: socially  . Drug use: No  . Sexual activity: Not on file  Other Topics Concern  . Not on file  Social History Narrative  . Not on file   Social  Determinants of Health   Financial Resource Strain: Not on file  Food Insecurity: Not on file  Transportation Needs: Not on file  Physical Activity: Not on file  Stress: Not on file  Social Connections: Not on file  Intimate Partner Violence: Not on file    Past Surgical History:  Procedure Laterality Date  . CATARACT EXTRACTION, BILATERAL  04/2021   05/2021  . PLACEMENT OF BREAST IMPLANTS Bilateral 05/28/1979  . TOTAL ABDOMINAL HYSTERECTOMY W/ BILATERAL SALPINGOOPHORECTOMY  05/28/1975      Current Outpatient Medications:  .  amLODipine (NORVASC) 5 MG tablet, TAKE 1 TABLET BY MOUTH  DAILY, Disp: 100 tablet, Rfl: 2 .  atorvastatin (LIPITOR) 20 MG tablet, Take 1 tablet (20 mg total) by mouth daily., Disp: 90 tablet, Rfl: 3 .  dicyclomine (BENTYL) 10 MG capsule, TAKE 1 CAPSULE BY MOUTH 3 TIMES  DAILY BEFORE MEALS, Disp: 300 capsule, Rfl: 0 .  indapamide (LOZOL) 1.25 MG tablet, TAKE 1 TABLET BY MOUTH  DAILY, Disp: 100 tablet, Rfl: 2 .  levothyroxine (SYNTHROID) 50 MCG tablet, TAKE 1 TABLET BY MOUTH DAILY, Disp: 100 tablet, Rfl: 2 .  losartan (COZAAR) 100 MG tablet, TAKE 1 TABLET BY MOUTH DAILY, Disp: 100 tablet, Rfl: 2 .  metoprolol succinate (TOPROL-XL) 25 MG 24  hr tablet, TAKE 1 TABLET BY MOUTH  TWICE DAILY, Disp: 90 tablet, Rfl: 1 .  Multiple Vitamin (MULTIVITAMIN) tablet, Take 1 tablet by mouth daily., Disp: , Rfl:  .  PREMARIN 0.625 MG tablet, TAKE 1 TABLET BY MOUTH  DAILY FOR 21 DAYS THEN DO  NOT TAKE FOR 7 DAYS, Disp: 68 tablet, Rfl: 3    Physical Exam: There were no vitals taken for this visit.    Affect appropriate Healthy:  appears stated age 19: normal Neck supple with no adenopathy JVP normal no bruits no thyromegaly Lungs clear with no wheezing and good diaphragmatic motion Heart:  S1/S2 no murmur, no rub, gallop or click PMI normal Breast implants  Abdomen: benighn, BS positve, no tenderness, no AAA no bruit.  No HSM or HJR  Distal pulses intact with no  bruits No edema Neuro non-focal  prior lumbar disc surgery  Skin warm and dry No muscular weakness   Labs:   Lab Results  Component Value Date   WBC 10.2 11/19/2021   HGB 11.9 11/19/2021   HCT 36.7 11/19/2021   MCV 94.1 11/19/2021   PLT 337 11/19/2021   No results for input(s): "NA", "K", "CL", "CO2", "BUN", "CREATININE", "CALCIUM", "PROT", "BILITOT", "ALKPHOS", "ALT", "AST", "GLUCOSE" in the last 168 hours.  Invalid input(s): "LABALBU" No results found for: "CKTOTAL", "CKMB", "CKMBINDEX", "TROPONINI"  Lab Results  Component Value Date   CHOL 181 11/19/2021   CHOL 183 11/13/2020   CHOL 175 11/12/2019   Lab Results  Component Value Date   HDL 66 11/19/2021   HDL 84 11/13/2020   HDL 64 11/12/2019   Lab Results  Component Value Date   LDLCALC 89 11/19/2021   LDLCALC 78 11/13/2020   LDLCALC 90 11/12/2019   Lab Results  Component Value Date   TRIG 162 (H) 11/19/2021   TRIG 130 11/13/2020   TRIG 110 11/12/2019   Lab Results  Component Value Date   CHOLHDL 2.7 11/19/2021   CHOLHDL 2.2 11/13/2020   CHOLHDL 2.7 11/12/2019   No results found for: "LDLDIRECT"    Radiology: No results found.  EKG: ***   ASSESSMENT AND PLAN:   CAD: sub clinical high calcium score Given how high and predominance in LAD will order PET/CT scan to r/o silent ischemia ASA  HLD:  given high calcium score target LDL < 70 lipitor dose increased f/u primary HTN:  Well controlled.  Continue current medications and low sodium Dash type diet.   Thyroid:  continue synthroid replacement TSH normal  Post Menapausal:  on premarin for years   PET/CT scan  F/U in a year if low risk   Signed: Jenkins Rouge 03/15/2022, 8:52 AM

## 2022-03-19 ENCOUNTER — Ambulatory Visit: Payer: Medicare Other | Attending: Cardiovascular Disease | Admitting: Cardiovascular Disease

## 2022-03-19 ENCOUNTER — Encounter: Payer: Self-pay | Admitting: Cardiovascular Disease

## 2022-03-19 VITALS — BP 126/66 | HR 73 | Ht 64.0 in | Wt 144.8 lb

## 2022-03-19 DIAGNOSIS — E7849 Other hyperlipidemia: Secondary | ICD-10-CM | POA: Diagnosis not present

## 2022-03-19 DIAGNOSIS — I10 Essential (primary) hypertension: Secondary | ICD-10-CM | POA: Diagnosis not present

## 2022-03-19 DIAGNOSIS — I2510 Atherosclerotic heart disease of native coronary artery without angina pectoris: Secondary | ICD-10-CM | POA: Diagnosis not present

## 2022-03-19 NOTE — Patient Instructions (Signed)
Medication Instructions:  Your physician recommends that you continue on your current medications as directed. Please refer to the Current Medication list given to you today.  *If you need a refill on your cardiac medications before your next appointment, please call your pharmacy*  Lab Work: If you have labs (blood work) drawn today and your tests are completely normal, you will receive your results only by: [place] (if you have MyChart) OR A paper copy in the mail If you have any lab test that is abnormal or we need to change your treatment, we will call you to review the results.  Testing/Procedures: Your physician has requested that you have en exercise stress myoview. For further information please visit [email or web address]. Please follow instruction sheet, as given.   Follow-Up: At [facility], you and your health needs are our priority.  As part of our continuing mission to provide you with exceptional heart care, we have created designated Provider Care Teams.  These Care Teams include your primary Cardiologist (physician) and Advanced Practice Providers (APPs -  Physician Assistants and Nurse Practitioners) who all work together to provide you with the care you need, when you need it.  We recommend signing up for the patient portal called "MyChart".  Sign up information is provided on this After Visit Summary.  MyChart is used to connect with patients for Virtual Visits (Telemedicine).  Patients are able to view lab/test results, encounter notes, upcoming appointments, etc.  Non-urgent messages can be sent to your provider as well.   To learn more about what you can do with MyChart, go to [email or web address].    Your next appointment:   12 month(s)  The format for your next appointment:   In Person  Provider:   Jenkins Rouge, MD      Important Information About Sugar

## 2022-03-20 ENCOUNTER — Telehealth (HOSPITAL_COMMUNITY): Payer: Self-pay | Admitting: *Deleted

## 2022-03-20 NOTE — Telephone Encounter (Signed)
Left message on voicemail per DPR in reference to upcoming appointment scheduled on 03/25/2022 at 10:30 with detailed instructions given per Myocardial Perfusion Study Information Sheet for the test. LM to arrive 15 minutes early, and that it is imperative to arrive on time for appointment to keep from having the test rescheduled. If you need to cancel or reschedule your appointment, please call the office within 24 hours of your appointment. Failure to do so may result in a cancellation of your appointment, and a $50 no show fee. Phone number given for call back for any questions.

## 2022-03-21 ENCOUNTER — Other Ambulatory Visit: Payer: Self-pay

## 2022-03-21 DIAGNOSIS — E7849 Other hyperlipidemia: Secondary | ICD-10-CM

## 2022-03-21 DIAGNOSIS — I2510 Atherosclerotic heart disease of native coronary artery without angina pectoris: Secondary | ICD-10-CM

## 2022-03-21 DIAGNOSIS — I10 Essential (primary) hypertension: Secondary | ICD-10-CM

## 2022-03-21 NOTE — Progress Notes (Signed)
Placed order for attestation for Dr. Johnsie Cancel to order.

## 2022-03-25 ENCOUNTER — Ambulatory Visit (HOSPITAL_COMMUNITY): Payer: Medicare Other | Attending: Cardiovascular Disease

## 2022-03-25 DIAGNOSIS — E7849 Other hyperlipidemia: Secondary | ICD-10-CM | POA: Insufficient documentation

## 2022-03-25 DIAGNOSIS — I2510 Atherosclerotic heart disease of native coronary artery without angina pectoris: Secondary | ICD-10-CM | POA: Insufficient documentation

## 2022-03-25 DIAGNOSIS — I10 Essential (primary) hypertension: Secondary | ICD-10-CM | POA: Insufficient documentation

## 2022-03-25 LAB — MYOCARDIAL PERFUSION IMAGING
Angina Index: 0
Duke Treadmill Score: 5
Estimated workload: 4.6
Exercise duration (min): 4 min
Exercise duration (sec): 32 s
LV dias vol: 61 mL (ref 46–106)
LV sys vol: 20 mL
MPHR: 142 {beats}/min
Nuc Stress EF: 67 %
Peak HR: 169 {beats}/min
Percent HR: 119 %
Rest HR: 70 {beats}/min
Rest Nuclear Isotope Dose: 7.9 mCi
SDS: 1
SRS: 2
SSS: 3
ST Depression (mm): 0 mm
Stress Nuclear Isotope Dose: 30 mCi
TID: 0.84

## 2022-03-25 MED ORDER — TECHNETIUM TC 99M TETROFOSMIN IV KIT
7.9000 | PACK | Freq: Once | INTRAVENOUS | Status: AC | PRN
  Administered 2022-03-25: 7.9 via INTRAVENOUS

## 2022-03-25 MED ORDER — TECHNETIUM TC 99M TETROFOSMIN IV KIT
30.0000 | PACK | Freq: Once | INTRAVENOUS | Status: AC | PRN
  Administered 2022-03-25: 30 via INTRAVENOUS

## 2022-03-27 ENCOUNTER — Telehealth: Payer: Self-pay | Admitting: Cardiovascular Disease

## 2022-03-27 NOTE — Telephone Encounter (Signed)
Left message for patient to call back  

## 2022-03-27 NOTE — Telephone Encounter (Signed)
Follow Up:    Patient is returning Pam's call from today.

## 2022-03-28 NOTE — Telephone Encounter (Signed)
Patient aware of results.

## 2022-05-13 ENCOUNTER — Other Ambulatory Visit: Payer: Self-pay | Admitting: Internal Medicine

## 2022-05-29 DIAGNOSIS — Z961 Presence of intraocular lens: Secondary | ICD-10-CM | POA: Diagnosis not present

## 2022-06-12 ENCOUNTER — Other Ambulatory Visit: Payer: Self-pay | Admitting: Internal Medicine

## 2022-07-24 ENCOUNTER — Other Ambulatory Visit: Payer: Self-pay | Admitting: Internal Medicine

## 2022-10-01 ENCOUNTER — Other Ambulatory Visit: Payer: Self-pay | Admitting: Internal Medicine

## 2022-10-18 ENCOUNTER — Other Ambulatory Visit: Payer: Self-pay | Admitting: Internal Medicine

## 2022-11-21 ENCOUNTER — Other Ambulatory Visit: Payer: Self-pay | Admitting: Internal Medicine

## 2022-11-25 ENCOUNTER — Other Ambulatory Visit: Payer: Medicare Other

## 2022-11-25 DIAGNOSIS — I10 Essential (primary) hypertension: Secondary | ICD-10-CM | POA: Diagnosis not present

## 2022-11-25 DIAGNOSIS — E7800 Pure hypercholesterolemia, unspecified: Secondary | ICD-10-CM | POA: Diagnosis not present

## 2022-11-25 DIAGNOSIS — E039 Hypothyroidism, unspecified: Secondary | ICD-10-CM | POA: Diagnosis not present

## 2022-11-25 NOTE — Progress Notes (Signed)
Annual Wellness Visit    Patient Care Team: Debanhi Blaker, Luanna Cole, MD as PCP - General (Internal Medicine) Wendall Stade, MD as PCP - Cardiology (Cardiology)  Visit Date: 12/02/22   Chief Complaint  Patient presents with   Annual Exam   Medicare Wellness    Subjective:   Patient: Kristen Hopkins, Female    DOB: Sep 29, 1940, 82 y.o.   MRN: [id number]  Kristen Hopkins is a 82 y.o. Female who presents today for her Annual Wellness Visit.  History of hyperlipidemia treated with atorvastatin 20 mg daily. Lipid panel normal.   History of hypertension treated with amlodipine 5 mg daily, metoprolol succinate 25 mg twice daily, losartan 100 mg daily, indapamide 1.25 mg daily. Blood pressure normal today at 118/62.  History of IBS treated with bentyl 10 mg three times daily before meals.  She has bilateral breast implants and does not want to have mammogram because of bruising and pain with previous mammograms with these implants.  Had bilateral cataract extractions in the Fall 2022 by Dr. Randon Goldsmith.  In 2007 she had a negative Cardiolite study.  She had a photosensitivity to reaction to [facility] many years ago and therefore takes Lozol instead.  Amlodipine causes leg swelling.  Does not like Hycodan for cough but can take Tussionex.  Apparent gastroenteritis in April 2020 that was viral.  It took several days to resolve.  Was treated with antinausea medication.  It had a sudden onset.  Had ophthalmic migraine in 2010.  Herpes zoster 2001.  Hysterectomy with Uni oophorectomy 1977.  Bilateral breast implants 1991.  Surgery for herniated lumbar disc L5-S1 in 1998.  Reports frequent ankle/feet swelling.   Glucose elevated at 100. Creatinine elevated at 1.15. GFR low at 48. Calcium elevated at 10.6. RBC low at 3.76. HCT low at 34.6. TSH at 2.44.  She had colonoscopy in 2015.  Rectal biopsy showed mild active colitis.  She is currently asymptomatic.  She was treated with Canasa at that time but  currently is not on it.  10-year follow-up was recommended for colon cancer screening.  Declines bone density study.  Social history: She is married.  3 adult children.  She formerly worked as a Librarian, academic.  She and her husband moved here in 70 from [place].  She quit smoking in 1998.  She drinks wine socially.  She and her husband are both retired now.  Family history: Father died of sudden cardiac arrest with history of defibrillator implantation, congestive heart failure, MI and CABG.  1 brother died of an MI.  3 brothers living.  1 brother with history of Crohn's disease.  Another brother died of unknown causes but had history of infection and alcohol abuse.  Mother lived to be in her 66s.   Past Medical History:  Diagnosis Date   Herpes zoster    Hyperlipidemia    Hypertension    IBS (irritable bowel syndrome)    Reflux      Family History  Problem Relation Age of Onset   Ulcerative colitis Brother    COPD Brother    CAD Father    Hypertension Father      Social Hx: Married. Retired. Nonsmoker. Quit smoking in 1998. Social ETOH. Previously worked as a Librarian, academic. 3 adult children    Review of Systems  Constitutional:  Negative for chills, fever, malaise/fatigue and weight loss.  HENT:  Negative for hearing loss, sinus pain and sore throat.   Respiratory:  Negative for cough, hemoptysis and shortness of breath.   Cardiovascular:  Positive for leg swelling (Ankles, feet). Negative for chest pain, palpitations and PND.  Gastrointestinal:  Negative for abdominal pain, constipation, diarrhea, heartburn, nausea and vomiting.  Genitourinary:  Negative for dysuria, frequency and urgency.  Musculoskeletal:  Negative for back pain, myalgias and neck pain.  Skin:  Negative for itching and rash.  Neurological:  Negative for dizziness, tingling, seizures and headaches.  Endo/Heme/Allergies:  Negative for polydipsia.  Psychiatric/Behavioral:  Negative for  depression. The patient is not nervous/anxious.       Objective:   Vitals: BP 118/62   Pulse 64   Resp 16   Ht 5' 4.5" (1.638 m)   Wt 141 lb 8 oz (64.2 kg)   SpO2 97%   BMI 23.91 kg/m   Physical Exam Vitals and nursing note reviewed.  Constitutional:      General: She is not in acute distress.    Appearance: Normal appearance. She is not ill-appearing or toxic-appearing.  HENT:     Head: Normocephalic and atraumatic.     Right Ear: Hearing, tympanic membrane, ear canal and external ear normal.     Left Ear: Hearing, tympanic membrane, ear canal and external ear normal.     Ears:     Comments: Some cerumen right ear canal.    Mouth/Throat:     Pharynx: Oropharynx is clear.  Eyes:     Extraocular Movements: Extraocular movements intact.     Pupils: Pupils are equal, round, and reactive to light.  Neck:     Thyroid: No thyroid mass, thyromegaly or thyroid tenderness.     Vascular: No carotid bruit.  Cardiovascular:     Rate and Rhythm: Normal rate and regular rhythm. No extrasystoles are present.    Pulses:          Dorsalis pedis pulses are 1+ on the right side and 1+ on the left side.       Posterior tibial pulses are 0 on the right side and 0 on the left side.     Heart sounds: Normal heart sounds. No murmur heard.    No friction rub. No gallop.     Comments: Superficial varicosities lower extremities. Trace lower extremity edema. Pulmonary:     Effort: Pulmonary effort is normal.     Breath sounds: Normal breath sounds. No decreased breath sounds, wheezing, rhonchi or rales.  Chest:     Chest wall: No mass.     Comments: Bilateral breast implants. Abdominal:     Palpations: Abdomen is soft. There is no hepatomegaly, splenomegaly or mass.     Tenderness: There is no abdominal tenderness.     Hernia: No hernia is present.  Musculoskeletal:     Cervical back: Normal range of motion.     Right lower leg: No edema.     Left lower leg: No edema.  Lymphadenopathy:      Cervical: No cervical adenopathy.     Upper Body:     Right upper body: No supraclavicular adenopathy.     Left upper body: No supraclavicular adenopathy.  Skin:    General: Skin is warm and dry.  Neurological:     General: No focal deficit present.     Mental Status: She is alert and oriented to person, place, and time. Mental status is at baseline.     Sensory: Sensation is intact.     Motor: Motor function is intact. No weakness.  Deep Tendon Reflexes: Reflexes are normal and symmetric.  Psychiatric:        Attention and Perception: Attention normal.        Mood and Affect: Mood normal.        Speech: Speech normal.        Behavior: Behavior normal.        Thought Content: Thought content normal.        Cognition and Memory: Cognition normal.        Judgment: Judgment normal.      Most recent functional status assessment:    12/02/2022   11:00 AM  In your present state of health, do you have any difficulty performing the following activities:  Hearing? 0  Vision? 0  Difficulty concentrating or making decisions? 0  Walking or climbing stairs? 0  Dressing or bathing? 0  Preparing Food and eating ? N  Using the Toilet? N  In the past six months, have you accidently leaked urine? N  Do you have problems with loss of bowel control? N  Managing your Medications? N  Managing your Finances? N  Housekeeping or managing your Housekeeping? N   Most recent fall risk assessment:    12/02/2022   11:01 AM  Fall Risk   Falls in the past year? 0  Injury with Fall? 0  Risk for fall due to : No Fall Risks    Most recent depression screenings:    12/02/2022   10:57 AM 01/22/2022   11:27 AM  PHQ 2/9 Scores  PHQ - 2 Score 0 0   Most recent cognitive screening:    12/02/2022   11:03 AM  6CIT Screen  What Year? 0 points  What month? 0 points  What time? 0 points  Count back from 20 0 points  Months in reverse 0 points  Repeat phrase 0 points  Total Score 0 points      Results:   Studies obtained and personally reviewed by me:  She had colonoscopy in 2015.  Rectal biopsy showed mild active colitis.  Labs:       Component Value Date/Time   NA 139 11/25/2022 0924   K 3.6 11/25/2022 0924   CL 101 11/25/2022 0924   CO2 29 11/25/2022 0924   GLUCOSE 100 (H) 11/25/2022 0924   BUN 22 11/25/2022 0924   CREATININE 1.15 (H) 11/25/2022 0924   CALCIUM 10.6 (H) 11/25/2022 0924   PROT 7.8 11/25/2022 0924   ALBUMIN 4.3 08/05/2016 1138   AST 19 11/25/2022 0924   ALT 15 11/25/2022 0924   ALKPHOS 114 08/05/2016 1138   BILITOT 0.5 11/25/2022 0924   GFRNONAA 56 (L) 11/13/2020 0928   GFRAA 64 11/13/2020 0928     Lab Results  Component Value Date   WBC 10.5 11/25/2022   HGB 12.0 11/25/2022   HCT 34.6 (L) 11/25/2022   MCV 92.0 11/25/2022   PLT 293 11/25/2022    Lab Results  Component Value Date   CHOL 165 11/25/2022   HDL 78 11/25/2022   LDLCALC 65 11/25/2022   TRIG 134 11/25/2022   CHOLHDL 2.1 11/25/2022    No results found for: "HGBA1C"   Lab Results  Component Value Date   TSH 2.44 11/25/2022    Assessment & Plan:   Hyperlipidemia: treated with atorvastatin 20 mg daily. Lipid panel normal.   Hypertension: treated with amlodipine 5 mg daily, metoprolol succinate 25 mg twice daily, losartan 100 mg daily, indapamide 1.25 mg daily.  Dependent edema: keep  feet elevated, watch salt intake. Call if symptoms worsen.  IBS: treated with bentyl 10 mg three times daily before meals.  Ordered repeat calcium, creatinine due to elevated levels on last lab work.  She had colonoscopy in 2015. Rectal biopsy showed mild active colitis.  Declines mammogram, bone density study.  Vaccine counseling: will go to pharmacy for tetanus, shingles vaccines.   Return in 1 year for health maintenance exam or as needed.Repeat creatinine is 1.07 and improved from 1.15 2 weeks ago. Serum calcium is 10.9. Do not take calcium supplement. Has had PTH assay  July 2023.     Annual wellness visit done today including the all of the following: Reviewed patient's Family Medical History Reviewed and updated list of patient's medical providers Assessment of cognitive impairment was done Assessed patient's functional ability Established a written schedule for health screening services Health Risk Assessent Completed and Reviewed  Discussed health benefits of physical activity, and encouraged her to engage in regular exercise appropriate for her age and condition.        I,Alexander Ruley,acting as a Neurosurgeon for Margaree Mackintosh, MD.,have documented all relevant documentation on the behalf of Margaree Mackintosh, MD,as directed by  Margaree Mackintosh, MD while in the presence of Margaree Mackintosh, MD.   I, Margaree Mackintosh, MD, have reviewed all documentation for this visit. The documentation on 12/15/22 for the exam, diagnosis, procedures, and orders are all accurate and complete.

## 2022-11-26 LAB — CBC WITH DIFFERENTIAL/PLATELET
Absolute Monocytes: 756 cells/uL (ref 200–950)
Basophils Absolute: 74 cells/uL (ref 0–200)
Basophils Relative: 0.7 %
Eosinophils Absolute: 231 cells/uL (ref 15–500)
Eosinophils Relative: 2.2 %
HCT: 34.6 % — ABNORMAL LOW (ref 35.0–45.0)
Hemoglobin: 12 g/dL (ref 11.7–15.5)
Lymphs Abs: 3381 cells/uL (ref 850–3900)
MCH: 31.9 pg (ref 27.0–33.0)
MCHC: 34.7 g/dL (ref 32.0–36.0)
MCV: 92 fL (ref 80.0–100.0)
MPV: 11.1 fL (ref 7.5–12.5)
Monocytes Relative: 7.2 %
Neutro Abs: 6059 cells/uL (ref 1500–7800)
Neutrophils Relative %: 57.7 %
Platelets: 293 10*3/uL (ref 140–400)
RBC: 3.76 10*6/uL — ABNORMAL LOW (ref 3.80–5.10)
RDW: 11.9 % (ref 11.0–15.0)
Total Lymphocyte: 32.2 %
WBC: 10.5 10*3/uL (ref 3.8–10.8)

## 2022-11-26 LAB — COMPLETE METABOLIC PANEL WITH GFR
AG Ratio: 1.2 (calc) (ref 1.0–2.5)
ALT: 15 U/L (ref 6–29)
AST: 19 U/L (ref 10–35)
Albumin: 4.3 g/dL (ref 3.6–5.1)
Alkaline phosphatase (APISO): 134 U/L (ref 37–153)
BUN/Creatinine Ratio: 19 (calc) (ref 6–22)
BUN: 22 mg/dL (ref 7–25)
CO2: 29 mmol/L (ref 20–32)
Calcium: 10.6 mg/dL — ABNORMAL HIGH (ref 8.6–10.4)
Chloride: 101 mmol/L (ref 98–110)
Creat: 1.15 mg/dL — ABNORMAL HIGH (ref 0.60–1.00)
Globulin: 3.5 g/dL (calc) (ref 1.9–3.7)
Glucose, Bld: 100 mg/dL — ABNORMAL HIGH (ref 65–99)
Potassium: 3.6 mmol/L (ref 3.5–5.3)
Sodium: 139 mmol/L (ref 135–146)
Total Bilirubin: 0.5 mg/dL (ref 0.2–1.2)
Total Protein: 7.8 g/dL (ref 6.1–8.1)
eGFR: 48 mL/min/{1.73_m2} — ABNORMAL LOW (ref >=60)

## 2022-11-26 LAB — LIPID PANEL
Cholesterol: 165 mg/dL (ref <200)
HDL: 78 mg/dL (ref >=50)
LDL Cholesterol (Calc): 65 mg/dL (calc)
Non-HDL Cholesterol (Calc): 87 mg/dL (calc) (ref <130)
Total CHOL/HDL Ratio: 2.1 (calc) (ref <5.0)
Triglycerides: 134 mg/dL (ref <150)

## 2022-11-26 LAB — TSH: TSH: 2.44 mIU/L (ref 0.40–4.50)

## 2022-12-02 ENCOUNTER — Ambulatory Visit (INDEPENDENT_AMBULATORY_CARE_PROVIDER_SITE_OTHER): Payer: Medicare Other | Admitting: Internal Medicine

## 2022-12-02 ENCOUNTER — Encounter: Payer: Self-pay | Admitting: Internal Medicine

## 2022-12-02 VITALS — BP 118/62 | HR 64 | Resp 16 | Ht 64.5 in | Wt 141.5 lb

## 2022-12-02 DIAGNOSIS — R7989 Other specified abnormal findings of blood chemistry: Secondary | ICD-10-CM | POA: Diagnosis not present

## 2022-12-02 DIAGNOSIS — I10 Essential (primary) hypertension: Secondary | ICD-10-CM

## 2022-12-02 DIAGNOSIS — Z0000 Encounter for general adult medical examination without abnormal findings: Secondary | ICD-10-CM | POA: Diagnosis not present

## 2022-12-02 DIAGNOSIS — E7800 Pure hypercholesterolemia, unspecified: Secondary | ICD-10-CM

## 2022-12-02 DIAGNOSIS — E039 Hypothyroidism, unspecified: Secondary | ICD-10-CM | POA: Diagnosis not present

## 2022-12-02 LAB — POCT URINALYSIS DIPSTICK
Bilirubin, UA: NEGATIVE
Blood, UA: NEGATIVE
Glucose, UA: NEGATIVE
Ketones, UA: NEGATIVE
Leukocytes, UA: NEGATIVE
Nitrite, UA: NEGATIVE
Protein, UA: NEGATIVE
Spec Grav, UA: 1.01 (ref 1.010–1.025)
Urobilinogen, UA: 0.2 E.U./dL
pH, UA: 6.5 (ref 5.0–8.0)

## 2022-12-02 LAB — CALCIUM: Calcium: 10.9 mg/dL — ABNORMAL HIGH (ref 8.6–10.4)

## 2022-12-02 LAB — CREATININE, SERUM: Creat: 1.07 mg/dL — ABNORMAL HIGH (ref 0.60–1.00)

## 2022-12-15 NOTE — Patient Instructions (Signed)
Labs are stable.  You do not have hyperparathyroidism.  You have elevated serum calcium but it does not bother you at the present time.  We will continue to monitor it.  Continue current medications and return in 1 year or as needed.  It was a pleasure to see you today.

## 2022-12-31 ENCOUNTER — Telehealth: Payer: Self-pay

## 2022-12-31 NOTE — Telephone Encounter (Signed)
Received fax from [facility] they want to change levothyroxine from [facility] to [facility]. Okay to change?

## 2023-01-03 NOTE — Telephone Encounter (Signed)
Called the pharmacy to make change.

## 2023-01-17 ENCOUNTER — Other Ambulatory Visit: Payer: Self-pay | Admitting: Internal Medicine

## 2023-04-12 ENCOUNTER — Other Ambulatory Visit: Payer: Self-pay | Admitting: Internal Medicine

## 2023-04-12 DIAGNOSIS — E039 Hypothyroidism, unspecified: Secondary | ICD-10-CM

## 2023-06-12 DIAGNOSIS — Z961 Presence of intraocular lens: Secondary | ICD-10-CM | POA: Diagnosis not present

## 2023-06-12 DIAGNOSIS — H52203 Unspecified astigmatism, bilateral: Secondary | ICD-10-CM | POA: Diagnosis not present

## 2023-06-17 NOTE — Progress Notes (Signed)
CARDIOLOGY CONSULT NOTE       [name] MRN: [id number] DOB/AGE: [birth year] 83 y.o.  Referring Physician: [place] Primary Physician: Margaree Mackintosh, MD Primary Cardiologist: Eden Emms Reason for Consultation: Abnormal Calcium Score   HPI:  83 y.o. referred by Dr Lenord Fellers for high calcium score  First seen on 03/19/22 Reviewed study read by myself 01/14/22 Total score 938 involving all 3 vessels This was 81 st percentile for age/sex History of HLD, HTN, IBS and reflux.  On statin Takes synthroid for low thyroid No clinical chest pain dyspnea, palpitations or syncope Concern for family history with dad having PPM/AICD/CABG and brother dying of MI  Married 3 adult kids Retired from being Librarian, academic Quit smoking in 1998 She moved from [place] in 1994 Husband from [place] area   Lipids 11/19/21 HDL 66, Triglycerides 162 LDL 89 Lipitor increased to 20 mg on 01/22/22   She is retired but active with no chest pain. She has 5 children One breeds terriers in [place] are in [place], one in [place] and one in [place]  Had normal myovue study 03/25/22 with no ischemia normal ECG and EF 67%  Active with no chest pain   ROS All other systems reviewed and negative except as noted above  Past Medical History:  Diagnosis Date   Herpes zoster    Hyperlipidemia    Hypertension    IBS (irritable bowel syndrome)    Reflux     Family History  Problem Relation Age of Onset   Ulcerative colitis Brother    COPD Brother    CAD Father    Hypertension Father     Social History   Socioeconomic History   Marital status: Married    Spouse name: Not on file   Number of children: Not on file   Years of education: Not on file   Highest education level: Not on file  Occupational History   Not on file  Tobacco Use   Smoking status: Former    Current packs/day: 0.00    Types: Cigarettes    Start date: 06/09/1966    Quit date: 06/10/1991    Years since quitting: 32.0    Smokeless tobacco: Never  Substance and Sexual Activity   Alcohol use: Yes    Comment: socially   Drug use: No   Sexual activity: Not on file  Other Topics Concern   Not on file  Social History Narrative   Not on file   Social Drivers of Health   Financial Resource Strain: Low Risk  (12/02/2022)   Overall Financial Resource Strain (CARDIA)    Difficulty of Paying Living Expenses: Not hard at all  Food Insecurity: No Food Insecurity (12/02/2022)   Hunger Vital Sign    Worried About Running Out of Food in the Last Year: Never true    Ran Out of Food in the Last Year: Never true  Transportation Needs: No Transportation Needs (12/02/2022)   PRAPARE - Administrator, Civil Service (Medical): No    Lack of Transportation (Non-Medical): No  Physical Activity: Sufficiently Active (12/02/2022)   Exercise Vital Sign    Days of Exercise per Week: 7 days    Minutes of Exercise per Session: 40 min  Stress: No Stress Concern Present (12/02/2022)   [employer] of Occupational Health - Occupational Stress Questionnaire    Feeling of Stress : Not at all  Social Connections: Moderately Isolated (12/02/2022)   Social Connection and Isolation  Panel [NHANES]    Frequency of Communication with Friends and Family: Three times a week    Frequency of Social Gatherings with Friends and Family: Once a week    Attends Religious Services: Never    Database administrator or Organizations: No    Attends Banker Meetings: Never    Marital Status: Married  Catering manager Violence: Unknown (12/02/2022)   Humiliation, Afraid, Rape, and Kick questionnaire    Fear of Current or Ex-Partner: No    Emotionally Abused: No    Physically Abused: Not on file    Sexually Abused: No    Past Surgical History:  Procedure Laterality Date   CATARACT EXTRACTION, BILATERAL  04/2021   05/2021   PLACEMENT OF BREAST IMPLANTS Bilateral 05/28/1979   TOTAL ABDOMINAL HYSTERECTOMY W/ BILATERAL  SALPINGOOPHORECTOMY  05/28/1975      Current Outpatient Medications:    amLODipine (NORVASC) 5 MG tablet, TAKE 1 TABLET BY MOUTH DAILY, Disp: 100 tablet, Rfl: 2   atorvastatin (LIPITOR) 20 MG tablet, TAKE 1 TABLET BY MOUTH DAILY, Disp: 100 tablet, Rfl: 2   dicyclomine (BENTYL) 10 MG capsule, TAKE 1 CAPSULE BY MOUTH 3 TIMES  DAILY BEFORE MEALS, Disp: 300 capsule, Rfl: 0   indapamide (LOZOL) 1.25 MG tablet, TAKE 1 TABLET BY MOUTH DAILY, Disp: 100 tablet, Rfl: 2   levothyroxine (SYNTHROID) 50 MCG tablet, TAKE 1 TABLET BY MOUTH DAILY, Disp: 100 tablet, Rfl: 2   losartan (COZAAR) 100 MG tablet, TAKE 1 TABLET BY MOUTH DAILY, Disp: 100 tablet, Rfl: 2   metoprolol succinate (TOPROL-XL) 25 MG 24 hr tablet, TAKE 1 TABLET BY MOUTH TWICE  DAILY, Disp: 200 tablet, Rfl: 2   Multiple Vitamin (MULTIVITAMIN) tablet, Take 1 tablet by mouth daily., Disp: , Rfl:    PREMARIN 0.625 MG tablet, TAKE 1 TABLET BY MOUTH DAILY FOR 21 DAYS ON THEN 7 DAYS OFF, Disp: 75 tablet, Rfl: 2    Physical Exam: Blood pressure 130/78, pulse 80, height 5' 4.5" (1.638 m), weight 144 lb (65.3 kg), SpO2 93%.    Affect appropriate Healthy:  appears stated age HEENT: normal Neck supple with no adenopathy JVP normal no bruits no thyromegaly Lungs clear with no wheezing and good diaphragmatic motion Heart:  S1/S2 no murmur, no rub, gallop or click Bilateral breast implants  PMI normal Breast implants  Abdomen: benighn, BS positve, no tenderness, no AAA no bruit.  No HSM or HJR  Distal pulses intact with no bruits No edema Neuro non-focal  prior lumbar disc surgery  Skin warm and dry No muscular weakness   Labs:   Lab Results  Component Value Date   WBC 10.5 11/25/2022   HGB 12.0 11/25/2022   HCT 34.6 (L) 11/25/2022   MCV 92.0 11/25/2022   PLT 293 11/25/2022   No results for input(s): "NA", "K", "CL", "CO2", "BUN", "CREATININE", "CALCIUM", "PROT", "BILITOT", "ALKPHOS", "ALT", "AST", "GLUCOSE" in the last 168  hours.  Invalid input(s): "LABALBU" No results found for: "CKTOTAL", "CKMB", "CKMBINDEX", "TROPONINI"  Lab Results  Component Value Date   CHOL 165 11/25/2022   CHOL 181 11/19/2021   CHOL 183 11/13/2020   Lab Results  Component Value Date   HDL 78 11/25/2022   HDL 66 11/19/2021   HDL 84 11/13/2020   Lab Results  Component Value Date   LDLCALC 65 11/25/2022   LDLCALC 89 11/19/2021   LDLCALC 78 11/13/2020   Lab Results  Component Value Date   TRIG 134 11/25/2022   TRIG  162 (H) 11/19/2021   TRIG 130 11/13/2020   Lab Results  Component Value Date   CHOLHDL 2.1 11/25/2022   CHOLHDL 2.7 11/19/2021   CHOLHDL 2.2 11/13/2020   No results found for: "LDLDIRECT"    Radiology: No results found.  EKG: SR PR 240 msec LAD poor R wave progression  06/27/2023 SR first degree 224 msec ICRBBB no acute changes    ASSESSMENT AND PLAN:   CAD: sub clinical high calcium score Given how high and predominance in LAD Ex myovue ordered Was normal 03/25/22 with EF 67% and no ischemia on ECG or perfusion images  HLD:  given high calcium score target LDL < 70 lipitor dose increased f/u primary HTN:  Well controlled.  Continue current medications and low sodium Dash type diet.   Thyroid:  continue synthroid replacement TSH normal  Post Menapausal:  on premarin for years  Conduction: long PR with LAD would not increase Toprol further     F/U in a year     Signed: Charlton Haws 06/27/2023, 2:13 PM

## 2023-06-27 ENCOUNTER — Ambulatory Visit: Payer: Medicare Other | Attending: Cardiovascular Disease | Admitting: Cardiovascular Disease

## 2023-06-27 VITALS — BP 130/78 | HR 80 | Ht 64.5 in | Wt 144.0 lb

## 2023-06-27 DIAGNOSIS — I2510 Atherosclerotic heart disease of native coronary artery without angina pectoris: Secondary | ICD-10-CM | POA: Diagnosis not present

## 2023-06-27 DIAGNOSIS — E7849 Other hyperlipidemia: Secondary | ICD-10-CM | POA: Diagnosis not present

## 2023-06-27 NOTE — Patient Instructions (Signed)
 Medication Instructions:  Your physician recommends that you continue on your current medications as directed. Please refer to the Current Medication list given to you today.  *If you need a refill on your cardiac medications before your next appointment, please call your pharmacy*  Lab Work: If you have labs (blood work) drawn today and your tests are completely normal, you will receive your results only by: MyChart Message (if you have MyChart) OR A paper copy in the mail If you have any lab test that is abnormal or we need to change your treatment, we will call you to review the results.  Testing/Procedures: None ordered today.  Follow-Up: At [facility], you and your health needs are our priority.  As part of our continuing mission to provide you with exceptional heart care, we have created designated Provider Care Teams.  These Care Teams include your primary Cardiologist (physician) and Advanced Practice Providers (APPs -  Physician Assistants and Nurse Practitioners) who all work together to provide you with the care you need, when you need it.  We recommend signing up for the patient portal called "MyChart".  Sign up information is provided on this After Visit Summary.  MyChart is used to connect with patients for Virtual Visits (Telemedicine).  Patients are able to view lab/test results, encounter notes, upcoming appointments, etc.  Non-urgent messages can be sent to your provider as well.   To learn more about what you can do with MyChart, go to [email or web address].    Your next appointment:   1 year(s)  Provider:   Charlton Haws, MD     Other Instructions

## 2023-08-16 ENCOUNTER — Other Ambulatory Visit: Payer: Self-pay | Admitting: Internal Medicine

## 2023-09-21 ENCOUNTER — Other Ambulatory Visit: Payer: Self-pay | Admitting: Internal Medicine

## 2023-09-21 DIAGNOSIS — K2100 Gastro-esophageal reflux disease with esophagitis, without bleeding: Secondary | ICD-10-CM

## 2023-11-25 ENCOUNTER — Other Ambulatory Visit

## 2023-11-25 DIAGNOSIS — Z13220 Encounter for screening for lipoid disorders: Secondary | ICD-10-CM | POA: Diagnosis not present

## 2023-11-25 DIAGNOSIS — E7800 Pure hypercholesterolemia, unspecified: Secondary | ICD-10-CM | POA: Diagnosis not present

## 2023-11-25 DIAGNOSIS — Z1329 Encounter for screening for other suspected endocrine disorder: Secondary | ICD-10-CM | POA: Diagnosis not present

## 2023-11-25 DIAGNOSIS — Z0000 Encounter for general adult medical examination without abnormal findings: Secondary | ICD-10-CM

## 2023-11-25 DIAGNOSIS — R7989 Other specified abnormal findings of blood chemistry: Secondary | ICD-10-CM | POA: Diagnosis not present

## 2023-11-25 NOTE — Progress Notes (Signed)
 Lab only

## 2023-11-26 LAB — CBC WITH DIFFERENTIAL/PLATELET
Absolute Lymphocytes: 3391 {cells}/uL (ref 850–3900)
Absolute Monocytes: 755 {cells}/uL (ref 200–950)
Basophils Absolute: 59 {cells}/uL (ref 0–200)
Basophils Relative: 0.6 %
Eosinophils Absolute: 186 {cells}/uL (ref 15–500)
Eosinophils Relative: 1.9 %
HCT: 35.9 % (ref 35.0–45.0)
Hemoglobin: 11.5 g/dL — ABNORMAL LOW (ref 11.7–15.5)
MCH: 30.7 pg (ref 27.0–33.0)
MCHC: 32 g/dL (ref 32.0–36.0)
MCV: 95.7 fL (ref 80.0–100.0)
MPV: 10.7 fL (ref 7.5–12.5)
Monocytes Relative: 7.7 %
Neutro Abs: 5410 {cells}/uL (ref 1500–7800)
Neutrophils Relative %: 55.2 %
Platelets: 322 10*3/uL (ref 140–400)
RBC: 3.75 10*6/uL — ABNORMAL LOW (ref 3.80–5.10)
RDW: 12.2 % (ref 11.0–15.0)
Total Lymphocyte: 34.6 %
WBC: 9.8 10*3/uL (ref 3.8–10.8)

## 2023-11-26 LAB — COMPLETE METABOLIC PANEL WITHOUT GFR
AG Ratio: 1.4 (calc) (ref 1.0–2.5)
ALT: 20 U/L (ref 6–29)
AST: 23 U/L (ref 10–35)
Albumin: 4.6 g/dL (ref 3.6–5.1)
Alkaline phosphatase (APISO): 121 U/L (ref 37–153)
BUN/Creatinine Ratio: 19 (calc) (ref 6–22)
BUN: 23 mg/dL (ref 7–25)
CO2: 33 mmol/L — ABNORMAL HIGH (ref 20–32)
Calcium: 11.4 mg/dL — ABNORMAL HIGH (ref 8.6–10.4)
Chloride: 99 mmol/L (ref 98–110)
Creat: 1.19 mg/dL — ABNORMAL HIGH (ref 0.60–0.95)
Globulin: 3.3 g/dL (ref 1.9–3.7)
Glucose, Bld: 101 mg/dL — ABNORMAL HIGH (ref 65–99)
Potassium: 4.1 mmol/L (ref 3.5–5.3)
Sodium: 140 mmol/L (ref 135–146)
Total Bilirubin: 0.5 mg/dL (ref 0.2–1.2)
Total Protein: 7.9 g/dL (ref 6.1–8.1)

## 2023-11-26 LAB — LIPID PANEL
Cholesterol: 167 mg/dL (ref <200)
HDL: 71 mg/dL (ref >=50)
LDL Cholesterol (Calc): 76 mg/dL
Non-HDL Cholesterol (Calc): 96 mg/dL (ref <130)
Total CHOL/HDL Ratio: 2.4 (calc) (ref <5.0)
Triglycerides: 113 mg/dL (ref <150)

## 2023-11-26 LAB — TSH: TSH: 6.54 m[IU]/L — ABNORMAL HIGH (ref 0.40–4.50)

## 2023-11-29 ENCOUNTER — Ambulatory Visit: Payer: Self-pay | Admitting: Internal Medicine

## 2023-12-02 ENCOUNTER — Other Ambulatory Visit: Payer: Medicare Other

## 2023-12-04 ENCOUNTER — Ambulatory Visit (INDEPENDENT_AMBULATORY_CARE_PROVIDER_SITE_OTHER): Payer: Medicare Other | Admitting: Internal Medicine

## 2023-12-04 VITALS — BP 120/70 | HR 86 | Ht 64.5 in | Wt 140.0 lb

## 2023-12-04 DIAGNOSIS — E7800 Pure hypercholesterolemia, unspecified: Secondary | ICD-10-CM

## 2023-12-04 DIAGNOSIS — Z0000 Encounter for general adult medical examination without abnormal findings: Secondary | ICD-10-CM

## 2023-12-04 DIAGNOSIS — E039 Hypothyroidism, unspecified: Secondary | ICD-10-CM

## 2023-12-04 DIAGNOSIS — Z9079 Acquired absence of other genital organ(s): Secondary | ICD-10-CM

## 2023-12-04 DIAGNOSIS — Z9882 Breast implant status: Secondary | ICD-10-CM | POA: Diagnosis not present

## 2023-12-04 DIAGNOSIS — R7989 Other specified abnormal findings of blood chemistry: Secondary | ICD-10-CM

## 2023-12-04 DIAGNOSIS — Z90710 Acquired absence of both cervix and uterus: Secondary | ICD-10-CM

## 2023-12-04 DIAGNOSIS — I10 Essential (primary) hypertension: Secondary | ICD-10-CM

## 2023-12-04 DIAGNOSIS — Z90722 Acquired absence of ovaries, bilateral: Secondary | ICD-10-CM

## 2023-12-04 DIAGNOSIS — Z13820 Encounter for screening for osteoporosis: Secondary | ICD-10-CM

## 2023-12-04 NOTE — Progress Notes (Signed)
 Annual Wellness Visit   Patient Care Team: Mitzie Marlar, Ronal PARAS, MD as PCP - General (Internal Medicine) Delford Maude BROCKS, MD as PCP - Cardiology (Cardiology)  Visit Date: 12/04/23   Chief Complaint  Patient presents with   Medicare Wellness   Annual Exam   Subjective:  Patient: Kristen Hopkins, Female DOB: [birth year], 83 y.o. MRN: [id number] Kristen Hopkins is a 82 y.o. Female who presents today for her Annual Wellness Visit. Patient has Hypertension; Hyperlipidemia; GE Reflux; History of Proctitis; Hypothyroidism; Ulcerative Proctitis w/o Complication; and Dupuytren's Disease, Palm of Right Hand.  History of Hypertension treated with Amlodipine  5 mg daily, Losartan  100 mg daily, Metoprolol  succinate 25 mg twice daily, and Indapamide  1.25 mg daily. Blood Pressure: normotensive today at 120/70. Amlodipine  causes some LE edema, she takes Lozol  instead of Mazide due to remote hx of photosensitivity reaction  History of Hyperlipidemia treated with Atorvastatin  20 mg. 11/25/2023 Lipid Panel: WNL. 2023 Coronary Cardiac Score: 938.   History of Hypothyroidism treated with Levothyroxine  50 mcg daily. 11/25/2023 TSH: 6.54. Calcium  11.4, elevated from 10.6 & 10.9 in 7/'24. Says that she hadn't taken her Levothyroxine  for 1 week prior to her labs when discussing TSH, and regarding the elevated calcium  she does take a multivitamin w/ calcium  in it. She does have a hx of elevated Calcium , having had a PTH assay twice in 11/2021 w/ Calcium  11.2 and 10.3, PTH 8 and 10, ruling out hyperparathyroidism.  History of IBS treated with Bentyl  10 mg three times daily before meals.   Labs 11/25/2023 CBC, compared to 11/2022: RBC 3.75, decreased slightly from 3.76; HGB 11.5, decreased from 12.0; otherwise WNL.  CMP, compared to 11/2022: CO2 33, elevated from 29; Glucose 101, elevated from 100; Creatinine 1.19, elevated from 1.15 & 1.07; Calcium  11.4, elevated from 10.6 & 10.9; otherwise WNL.   S/p Total Abdominal  Hysterectomy w/ BSO 1977. History of Estrogen Replacement with Premarin .  S/p Placement of Bilateral Breast Implants 1991.   Colonoscopy 10/01/2013 with findings of abnormal mucosa in the rectum (chronic mildly active colitis, negative for dysplasia per pathology); Mild diverticulosis in the ascending and transverse colon; and Moderate diverticulosis in the descending and sigmoid colon with repeat discontinued due to age.  Bone Density never done.   Vaccine Counseling: UTD on Flu, Shingles 1/2, PNA, and Tdap - got her Tdap 6/16, Shingrix 1/1 reported by patient. Past Medical History:  Diagnosis Date   Herpes zoster    Hyperlipidemia    Hypertension    IBS (irritable bowel syndrome)    Reflux    Medical/Surgical History Narrative:  Allergic/Intolerant to: Maxzide/HCTZ-Triamterene - photosensitivity reaction; does not like Hycodan for cough, can take Tussionex  2022/2023 - Bilateral Cataract Extractions by Dr. Charmayne in December '22 & January '23.  2020 - In April, had sudden onset of apparent gastroenteritis that was viral and took several days to resolve; was treated with anti-nausea medication.  2010 - Ophthalmic Migraine  2007 - Negative Cardiolite Study  2001 - Herpes Zoster  1998 - Herniated Lumbar Disc L5-S1 Repair  Past Surgical History:  Procedure Laterality Date   CATARACT EXTRACTION, BILATERAL  04/2021   05/2021   PLACEMENT OF BREAST IMPLANTS Bilateral 05/28/1979   TOTAL ABDOMINAL HYSTERECTOMY W/ BILATERAL SALPINGOOPHORECTOMY  05/28/1975   Family History  Problem Relation Age of Onset   Ulcerative colitis Brother    COPD Brother    CAD Father    Hypertension Father    Family History Narrative: Father deceased due  to sudden Cardiac Arrest w/ hx of Defibrillator Implantation, Congestive Heart Failure, MI, CAD, Hypertension, and CABG Mother lived to be in her 49s Brothers - 1 deceased due to an MI; 1 w/ hx of Crohn's Disease/Ulcerative Colitis; 1 deceased due to  unknown causes w/ hx of Infection and Alcohol Abuse; 1 w/ hx of COPD Social History   Social History Narrative   Formerly, she has worked as a Librarian, academic, but is now retired. Married - husband is also retired. Both her and her husband moved to [facility] from [place], [place] in 1994. Former smoker, quit 1994, social alcohol consumption. 3 adult children.   Review of Systems  Constitutional:  Negative for chills, fever, malaise/fatigue and weight loss.  HENT:  Negative for hearing loss, sinus pain and sore throat.   Respiratory:  Negative for cough, hemoptysis and shortness of breath.   Cardiovascular:  Negative for chest pain, palpitations, leg swelling and PND.  Gastrointestinal:  Negative for abdominal pain, constipation, diarrhea, heartburn, nausea and vomiting.  Genitourinary:  Negative for dysuria, frequency and urgency.  Musculoskeletal:  Negative for back pain, myalgias and neck pain.  Skin:  Negative for itching and rash.  Neurological:  Negative for dizziness, tingling, seizures and headaches.  Endo/Heme/Allergies:  Negative for polydipsia.  Psychiatric/Behavioral:  Negative for depression. The patient is not nervous/anxious.     Objective:  Vitals: BP 120/70   Pulse 86   Ht 5' 4.5 (1.638 m)   Wt 140 lb (63.5 kg)   SpO2 96%   BMI 23.66 kg/m  Physical Exam Vitals and nursing note reviewed.  Constitutional:      General: She is not in acute distress.    Appearance: Normal appearance. She is not ill-appearing or toxic-appearing.  HENT:     Head: Normocephalic and atraumatic.     Right Ear: Hearing, tympanic membrane, ear canal and external ear normal.     Left Ear: Hearing, tympanic membrane, ear canal and external ear normal.     Mouth/Throat:     Pharynx: Oropharynx is clear.  Eyes:     Extraocular Movements: Extraocular movements intact.     Pupils: Pupils are equal, round, and reactive to light.  Neck:     Thyroid : No thyroid  mass, thyromegaly or thyroid  tenderness.      Vascular: No carotid bruit.  Cardiovascular:     Rate and Rhythm: Normal rate and regular rhythm. No extrasystoles are present.    Pulses:          Dorsalis pedis pulses are 2+ on the right side and 2+ on the left side.     Heart sounds: Normal heart sounds. No murmur heard.    No friction rub. No gallop.  Pulmonary:     Effort: Pulmonary effort is normal.     Breath sounds: Normal breath sounds. No decreased breath sounds, wheezing, rhonchi or rales.  Chest:     Chest wall: No mass.     Comments: Bilateral breast implants noted Abdominal:     Palpations: Abdomen is soft. There is no hepatomegaly, splenomegaly or mass.     Tenderness: There is no abdominal tenderness.     Hernia: No hernia is present.  Genitourinary:    Comments: GYN exam deferred. Musculoskeletal:     Cervical back: Normal range of motion.     Right lower leg: No edema.     Left lower leg: No edema.  Lymphadenopathy:     Cervical: No cervical adenopathy.     Upper  Body:     Right upper body: No supraclavicular adenopathy.     Left upper body: No supraclavicular adenopathy.  Skin:    General: Skin is warm and dry.  Neurological:     General: No focal deficit present.     Mental Status: She is alert and oriented to person, place, and time. Mental status is at baseline.     Sensory: Sensation is intact.     Motor: Motor function is intact. No weakness.     Deep Tendon Reflexes: Reflexes are normal and symmetric.  Psychiatric:        Attention and Perception: Attention normal.        Mood and Affect: Mood normal.        Speech: Speech normal.        Behavior: Behavior normal.        Thought Content: Thought content normal.        Cognition and Memory: Cognition normal.        Judgment: Judgment normal.   Most Recent Functional Status Assessment:    12/04/2023   10:58 AM  In your present state of health, do you have any difficulty performing the following activities:  Hearing? 0  Vision? 0  Difficulty  concentrating or making decisions? 0  Walking or climbing stairs? 0  Dressing or bathing? 0  Doing errands, shopping? 0  Preparing Food and eating ? N  Using the Toilet? N  In the past six months, have you accidently leaked urine? N  Do you have problems with loss of bowel control? N  Managing your Medications? N  Managing your Finances? N  Housekeeping or managing your Housekeeping? N   Most Recent Fall Risk Assessment:    12/04/2023   10:58 AM  Fall Risk   Falls in the past year? 0  Number falls in past yr: 0  Injury with Fall? 0  Risk for fall due to : No Fall Risks  Follow up Education provided;Falls prevention discussed;Falls evaluation completed   Most Recent Depression Screenings:    12/04/2023   10:57 AM 12/02/2022   10:57 AM  PHQ 2/9 Scores  PHQ - 2 Score 0 0   Most Recent Cognitive Screening:    12/04/2023   11:07 AM  6CIT Screen  What Year? 0 points  What month? 0 points  What time? 0 points  Count back from 20 0 points  Months in reverse 0 points  Repeat phrase 0 points  Total Score 0 points   Results:  Studies Obtained And Personally Reviewed By Me:  2023 Coronary Cardiac Score: 938.   Colonoscopy 10/01/2013 with findings of abnormal mucosa in the rectum (chronic mildly active colitis, negative for dysplasia per pathology); Mild diverticulosis in the ascending and transverse colon; and Moderate diverticulosis in the descending and sigmoid colon.  Labs:     Component Value Date/Time   NA 140 11/25/2023 1017   K 4.1 11/25/2023 1017   CL 99 11/25/2023 1017   CO2 33 (H) 11/25/2023 1017   GLUCOSE 101 (H) 11/25/2023 1017   BUN 23 11/25/2023 1017   CREATININE 1.19 (H) 11/25/2023 1017   CALCIUM  11.4 (H) 11/25/2023 1017   PROT 7.9 11/25/2023 1017   ALBUMIN 4.3 08/05/2016 1138   AST 23 11/25/2023 1017   ALT 20 11/25/2023 1017   ALKPHOS 114 08/05/2016 1138   BILITOT 0.5 11/25/2023 1017   GFRNONAA 56 (L) 11/13/2020 0928   GFRAA 64 11/13/2020 0928     Lab  Results  Component Value Date   WBC 9.8 11/25/2023   HGB 11.5 (L) 11/25/2023   HCT 35.9 11/25/2023   MCV 95.7 11/25/2023   PLT 322 11/25/2023   Lab Results  Component Value Date   CHOL 167 11/25/2023   HDL 71 11/25/2023   LDLCALC 76 11/25/2023   TRIG 113 11/25/2023   CHOLHDL 2.4 11/25/2023    Lab Results  Component Value Date   TSH 6.54 (H) 11/25/2023    Assessment & Plan:   Orders Placed This Encounter  Procedures   DG Bone Density    Standing Status:   Future    Expiration Date:   12/03/2024    Reason for Exam (SYMPTOM  OR DIAGNOSIS REQUIRED):   postmenopausal estrogen deficiency    Preferred [facility]   Parathyroid  hormone, intact (no Ca)  Other Labs Reviewed today: CBC, compared to 11/2022: RBC 3.75, decreased slightly from 3.76; HGB 11.5, decreased from 12.0; otherwise WNL.  CMP, compared to 11/2022: CO2 33, elevated from 29; Glucose 101, elevated from 100; Creatinine 1.19, elevated from 1.15 & 1.07; Calcium  11.4, elevated from 10.6 & 10.9; otherwise WNL.   Hypertension treated with Amlodipine  5 mg daily, Losartan  100 mg daily, Metoprolol  succinate 25 mg twice daily, and Indapamide  1.25 mg daily. Blood Pressure: normotensive today at 120/70.   Hyperlipidemia treated with Atorvastatin  20 mg. 11/25/2023 Lipid Panel: WNL. 2023 Coronary Cardiac Score: 938.   Hypothyroidism treated with Levothyroxine  50 mcg daily. 11/25/2023 TSH: 6.54. For 1 week prior had not taken her Levothyroxine , do not feel need to repeat.   Elevated Calcium  11.4, elevated from 10.6 & 10.9 in 7/'24. She does take a multivitamin w/ calcium  in it, however, has a hx of elevated Calcium  and subsequently had PTH assay twice in 11/2021 w/ Calcium  11.2 and 10.3, PTH 8 and 10, ruling out hyperparathyroidism, but will repeat this again today. No longer has elevated calcium  level but PTH remains low. Feels fine continue to monitor annually. Discussed Endocrine evaluation but she  declines at this time.  IBS treated with Bentyl  10 mg three times daily before meals.   S/p Total Abdominal Hysterectomy w/ BSO 1977.   Estrogen Replacement with Premarin .  S/p Placement of Bilateral Breast Implants 1991.   Colonoscopy 10/01/2013 with findings of abnormal mucosa in the rectum (chronic mildly active colitis, negative for dysplasia per pathology); Mild diverticulosis in the ascending and transverse colon; and Moderate diverticulosis in the descending and sigmoid colon with repeat discontinued due to age.  Bone Density never done - ordered today.   Vaccine Counseling: UTD on Flu, Shingles 1/2, PNA, and Tdap.  Return in 1 year (on 12/06/2024) for annual labs and then on 12/09/2024 for annual visit, or as needed.   Annual wellness visit done today including the all of the following: Reviewed patient's Family Medical History Reviewed and updated list of patient's medical providers Assessment of cognitive impairment was done Assessed patient's functional ability Established a written schedule for health screening services Health Risk Assessent Completed and Reviewed  Discussed health benefits of physical activity, and encouraged her to engage in regular exercise appropriate for her age and condition.    I,Emily Lagle,acting as a Neurosurgeon for Ronal JINNY Hailstone, MD.,have documented all relevant documentation on the behalf of Ronal JINNY Hailstone, MD,as directed by  Ronal JINNY Hailstone, MD while in the presence of Ronal JINNY Hailstone, MD.   I, Ronal JINNY Hailstone, MD, have reviewed all documentation for this visit. The  documentation on 12/14/23 for the exam, diagnosis, procedures, and orders are all accurate and complete.

## 2023-12-05 ENCOUNTER — Ambulatory Visit: Payer: Self-pay | Admitting: Internal Medicine

## 2023-12-05 LAB — PARATHYROID HORMONE, INTACT (NO CA): PTH: 7 pg/mL — ABNORMAL LOW (ref 16–77)

## 2023-12-12 NOTE — Progress Notes (Signed)
 Subjective:   Kristen Hopkins is a 83 y.o. female who presents for Medicare Annual (Subsequent) preventive examination.  Visit Complete: In person  Patient Medicare AWV questionnaire was completed by the patient on 12/04/2023; I have confirmed that all information answered by patient is correct and no changes since this date.  Cardiac Risk Factors include: advanced age (>68men, >32 women);dyslipidemia;hypertension     Objective:    Today's Vitals   12/04/23 1057 12/04/23 1108  BP: 120/70   Pulse: 86   SpO2: 96%   Weight: 140 lb (63.5 kg)   Height: 5' 4.5 (1.638 m)   PainSc: 0-No pain 0-No pain   Body mass index is 23.66 kg/m.     12/04/2023   11:10 AM 12/02/2022   11:01 AM 11/30/2021   10:45 AM  Advanced Directives  Does Patient Have a Medical Advance Directive? Yes Yes Yes  Type of Advance Directive Living will;Healthcare Power of [employer] Power of [place];Living will Healthcare Power of [place];Living will  Does patient want to make changes to medical advance directive?  No - Patient declined No - Patient declined  Copy of Healthcare Power of Attorney in Chart? No - copy requested No - copy requested No - copy requested    Current Medications (verified) Outpatient Encounter Medications as of 12/04/2023  Medication Sig   amLODipine  (NORVASC ) 5 MG tablet TAKE 1 TABLET BY MOUTH DAILY   atorvastatin  (LIPITOR) 20 MG tablet TAKE 1 TABLET BY MOUTH ONCE  DAILY   dicyclomine  (BENTYL ) 10 MG capsule TAKE 1 CAPSULE BY MOUTH 3 TIMES  DAILY BEFORE MEALS   indapamide  (LOZOL ) 1.25 MG tablet TAKE 1 TABLET BY MOUTH DAILY   levothyroxine  (SYNTHROID ) 50 MCG tablet TAKE 1 TABLET BY MOUTH DAILY   losartan  (COZAAR ) 100 MG tablet TAKE 1 TABLET BY MOUTH DAILY   metoprolol  succinate (TOPROL -XL) 25 MG 24 hr tablet TAKE 1 TABLET BY MOUTH TWICE  DAILY   Multiple Vitamin (MULTIVITAMIN) tablet Take 1 tablet by mouth daily.   PREMARIN  0.625 MG tablet TAKE 1 TABLET BY MOUTH DAILY FOR 21  DAYS ON THEN 7 DAYS OFF   No facility-administered encounter medications on file as of 12/04/2023.    Allergies (verified) Maxzide [hydrochlorothiazide-triamterene]   History: Past Medical History:  Diagnosis Date   Herpes zoster    Hyperlipidemia    Hypertension    IBS (irritable bowel syndrome)    Reflux    Past Surgical History:  Procedure Laterality Date   CATARACT EXTRACTION, BILATERAL  04/2021   05/2021   PLACEMENT OF BREAST IMPLANTS Bilateral 05/28/1979   TOTAL ABDOMINAL HYSTERECTOMY W/ BILATERAL SALPINGOOPHORECTOMY  05/28/1975   Family History  Problem Relation Age of Onset   Ulcerative colitis Brother    COPD Brother    CAD Father    Hypertension Father    Social History   Socioeconomic History   Marital status: Married    Spouse name: Not on file   Number of children: Not on file   Years of education: Not on file   Highest education level: Associate degree: academic program  Occupational History   Not on file  Tobacco Use   Smoking status: Former    Current packs/day: 0.00    Types: Cigarettes    Start date: 06/09/1966    Quit date: 06/10/1991    Years since quitting: 32.5   Smokeless tobacco: Never  Substance and Sexual Activity   Alcohol use: Yes    Comment: socially   Drug use:  No   Sexual activity: Not on file  Other Topics Concern   Not on file  Social History Narrative   Formerly, she has worked as a Librarian, academic, but is now retired. Married - husband is also retired. Both her and her husband moved to [facility] from [place], [place] in 1994. Former smoker, quit 1994, social alcohol consumption. 3 adult children.   Social Drivers of Corporate investment banker Strain: Low Risk  (12/04/2023)   Overall Financial Resource Strain (CARDIA)    Difficulty of Paying Living Expenses: Not hard at all  Food Insecurity: No Food Insecurity (12/04/2023)   Hunger Vital Sign    Worried About Running Out of Food in the Last Year: Never true    Ran Out of Food in  the Last Year: Never true  Transportation Needs: No Transportation Needs (12/04/2023)   PRAPARE - Administrator, Civil Service (Medical): No    Lack of Transportation (Non-Medical): No  Physical Activity: Sufficiently Active (12/04/2023)   Exercise Vital Sign    Days of Exercise per Week: 5 days    Minutes of Exercise per Session: 30 min  Stress: No Stress Concern Present (12/04/2023)   [employer] of Occupational Health - Occupational Stress Questionnaire    Feeling of Stress: Not at all  Social Connections: Moderately Isolated (12/04/2023)   Social Connection and Isolation Panel    Frequency of Communication with Friends and Family: More than three times a week    Frequency of Social Gatherings with Friends and Family: Once a week    Attends Religious Services: Patient declined    Database administrator or Organizations: No    Attends Engineer, structural: Not on file    Marital Status: Married    Tobacco Counseling Counseling given: Not Answered   Clinical Intake:  Pre-visit preparation completed: Yes  Pain : No/denies pain Pain Score: 0-No pain     BMI - recorded: 23.66 Nutritional Status: BMI of 19-24  Normal Nutritional Risks: Other (Comment) Diabetes: No  How often do you need to have someone help you when you read instructions, pamphlets, or other written materials from your doctor or pharmacy?: 1 - Never  Interpreter Needed?: No  Information entered by :: Kristen Hopkins, CMA   Activities of Daily Living    12/04/2023   10:58 AM 12/04/2023    9:46 AM  In your present state of health, do you have any difficulty performing the following activities:  Hearing? 0 0  Vision? 0 0  Difficulty concentrating or making decisions? 0 0  Walking or climbing stairs? 0 0  Dressing or bathing? 0 0  Doing errands, shopping? 0 0  Preparing Food and eating ? N N  Using the Toilet? N N  In the past six months, have you accidently leaked urine? N  N  Do you have problems with loss of bowel control? N N  Managing your Medications? N N  Managing your Finances? N N  Housekeeping or managing your Housekeeping? N N    Patient Care Team: Perri Ronal PARAS, MD as PCP - General (Internal Medicine) Delford Maude BROCKS, MD as PCP - Cardiology (Cardiology)  Indicate any recent Medical Services you may have received from other than [facility] providers in the past year (date may be approximate).     Assessment:   This is a routine wellness examination for Kristen Hopkins.  Hearing/Vision screen No results found.   Goals Addressed   None  Depression Screen    12/04/2023   10:57 AM 12/02/2022   10:57 AM 01/22/2022   11:27 AM 11/30/2021   10:46 AM 11/16/2020    2:12 PM 11/15/2019   11:01 AM 10/06/2017    2:56 PM  PHQ 2/9 Scores  PHQ - 2 Score 0 0 0 0 0 0 0    Fall Risk    12/04/2023   10:58 AM 12/04/2023    9:46 AM 12/02/2022   11:01 AM 01/22/2022   11:27 AM 11/30/2021   10:46 AM  Fall Risk   Falls in the past year? 0 0 0 0 0  Number falls in past yr: 0   0 0  Injury with Fall? 0  0 0 0  Risk for fall due to : No Fall Risks  No Fall Risks No Fall Risks No Fall Risks  Follow up Education provided;Falls prevention discussed;Falls evaluation completed   Falls evaluation completed  Falls evaluation completed      Data saved with a previous flowsheet row definition    MEDICARE RISK AT HOME: Medicare Risk at Home Any stairs in or around the home?: No If so, are there any without handrails?: No Home free of loose throw rugs in walkways, pet beds, electrical cords, etc?: Yes Adequate lighting in your home to reduce risk of falls?: Yes Life alert?: No Use of a cane, walker or w/c?: No Grab bars in the bathroom?: Yes Shower chair or bench in shower?: No Elevated toilet seat or a handicapped toilet?: Yes  TIMED UP AND GO:  Was the test performed?  No    Cognitive Function:        12/04/2023   11:07 AM 12/02/2022   11:03 AM 11/30/2021   10:47 AM   6CIT Screen  What Year? 0 points 0 points 0 points  What month? 0 points 0 points 0 points  What time? 0 points 0 points 0 points  Count back from 20 0 points 0 points 0 points  Months in reverse 0 points 0 points 0 points  Repeat phrase 0 points 0 points 0 points  Total Score 0 points 0 points 0 points    Immunizations Immunization History  Administered Date(s) Administered   Influenza-Unspecified 03/04/2019   PFIZER(Purple Top)SARS-COV-2 Vaccination 06/10/2019, 06/28/2019, 03/02/2020   Pneumococcal Conjugate-13 09/18/2015   Pneumococcal Polysaccharide-23 06/10/2011   Tdap 07/11/2001, 06/10/2011, 11/10/2023   Zoster, Live 04/05/2010    TDAP status: Up to date  Flu Vaccine status: Due, Education has been provided regarding the importance of this vaccine. Advised may receive this vaccine at local pharmacy or Health Dept. Aware to provide a copy of the vaccination record if obtained from local pharmacy or Health Dept. Verbalized acceptance and understanding.  Pneumococcal vaccine status: Up to date  Covid-19 vaccine status: Declined, Education has been provided regarding the importance of this vaccine but patient still declined. Advised may receive this vaccine at local pharmacy or Health Dept.or vaccine clinic. Aware to provide a copy of the vaccination record if obtained from local pharmacy or Health Dept. Verbalized acceptance and understanding.  Qualifies for Shingles Vaccine? Yes   Zostavax completed Yes   Shingrix Completed?: No.    Education has been provided regarding the importance of this vaccine. Patient has been advised to call insurance company to determine out of pocket expense if they have not yet received this vaccine. Advised may also receive vaccine at local pharmacy or Health Dept. Verbalized acceptance and understanding.  Screening Tests Health Maintenance  Topic Date Due   Zoster Vaccines- Shingrix (1 of 2) 05/02/1993   DEXA SCAN  Never done   INFLUENZA  VACCINE  12/26/2023   Medicare Annual Wellness (AWV)  12/03/2024   DTaP/Tdap/Td (4 - Td or Tdap) 11/09/2033   Pneumococcal Vaccine: 50+ Years  Completed   Hepatitis B Vaccines  Aged Out   HPV VACCINES  Aged Out   Meningococcal B Vaccine  Aged Out   MAMMOGRAM  Discontinued   Colonoscopy  Discontinued   COVID-19 Vaccine  Discontinued    Health Maintenance  Health Maintenance Due  Topic Date Due   Zoster Vaccines- Shingrix (1 of 2) 05/02/1993   DEXA SCAN  Never done    Colorectal cancer screening: No longer required.   Mammogram status: No longer required due to patient's preference.  Bone Density status: Ordered 12/04/2023. Pt provided with contact info and advised to call to schedule appt.  Lung Cancer Screening: (Low Dose CT Chest recommended if Age 36-80 years, 20 pack-year currently smoking OR have quit w/in 15years.) does not qualify.   Additional Screening:  Hepatitis C Screening: does not qualify; Completed   Vision Screening: Recommended annual ophthalmology exams for early detection of glaucoma and other disorders of the eye. Is the patient up to date with their annual eye exam?  Yes   If pt is not established with a provider, would they like to be referred to a provider to establish care? No .   Dental Screening: Recommended annual dental exams for proper oral hygiene  Community Resource Referral / Chronic Care Management: CRR required this visit?  No   CCM required this visit?  No     Plan:     I have personally reviewed and noted the following in the patient's chart:   Medical and social history Use of alcohol, tobacco or illicit drugs  Current medications and supplements including opioid prescriptions. Patient is not currently taking opioid prescriptions. Functional ability and status Nutritional status Physical activity Advanced directives List of other physicians Hospitalizations, surgeries, and ER visits in previous 12 months Vitals Screenings  to include cognitive, depression, and falls Referrals and appointments  In addition, I have reviewed and discussed with patient certain preventive protocols, quality metrics, and best practice recommendations. A written personalized care plan for preventive services as well as general preventive health recommendations were provided to patient.     Kristen Hopkins, CMA   12/12/2023   After Visit Summary: (In Person-Printed) AVS printed and given to the patient  I, Ronal JINNY Hailstone, MD, have reviewed all documentation for this visit. The documentation on 12/14/23 for the exam, diagnosis, procedures, and orders are all accurate and complete.

## 2023-12-14 NOTE — Patient Instructions (Signed)
 It was a pleasure to see you today. Continue current meds and return in one year or as needed.

## 2024-03-12 ENCOUNTER — Other Ambulatory Visit: Payer: Self-pay | Admitting: Internal Medicine

## 2024-03-23 ENCOUNTER — Other Ambulatory Visit: Payer: Self-pay | Admitting: Internal Medicine

## 2024-03-23 DIAGNOSIS — K2100 Gastro-esophageal reflux disease with esophagitis, without bleeding: Secondary | ICD-10-CM

## 2024-04-12 ENCOUNTER — Other Ambulatory Visit: Payer: Self-pay | Admitting: Internal Medicine

## 2024-04-12 DIAGNOSIS — E039 Hypothyroidism, unspecified: Secondary | ICD-10-CM

## 2024-04-12 DIAGNOSIS — K2100 Gastro-esophageal reflux disease with esophagitis, without bleeding: Secondary | ICD-10-CM

## 2024-06-25 ENCOUNTER — Other Ambulatory Visit: Payer: Self-pay | Admitting: Internal Medicine

## 2024-06-25 DIAGNOSIS — K2100 Gastro-esophageal reflux disease with esophagitis, without bleeding: Secondary | ICD-10-CM

## 2024-06-29 ENCOUNTER — Ambulatory Visit: Admitting: Cardiovascular Disease

## 2024-08-19 ENCOUNTER — Ambulatory Visit: Admitting: Cardiovascular Disease

## 2024-12-06 ENCOUNTER — Other Ambulatory Visit: Payer: Self-pay

## 2024-12-09 ENCOUNTER — Ambulatory Visit: Payer: Self-pay | Admitting: Internal Medicine
